# Patient Record
Sex: Female | Born: 1955 | Race: Black or African American | Hispanic: No | Smoking: Never smoker
Health system: Southern US, Community
[De-identification: ages and names within clinical notes are randomized; demographics above are authoritative.]

## PROBLEM LIST (undated history)

## (undated) DIAGNOSIS — Z9289 Personal history of other medical treatment: Secondary | ICD-10-CM

## (undated) DIAGNOSIS — I10 Essential (primary) hypertension: Secondary | ICD-10-CM

## (undated) DIAGNOSIS — D497 Neoplasm of unspecified behavior of endocrine glands and other parts of nervous system: Secondary | ICD-10-CM

## (undated) DIAGNOSIS — E785 Hyperlipidemia, unspecified: Secondary | ICD-10-CM

## (undated) DIAGNOSIS — C801 Malignant (primary) neoplasm, unspecified: Secondary | ICD-10-CM

## (undated) DIAGNOSIS — Z973 Presence of spectacles and contact lenses: Secondary | ICD-10-CM

## (undated) DIAGNOSIS — D509 Iron deficiency anemia, unspecified: Secondary | ICD-10-CM

## (undated) DIAGNOSIS — Z800 Family history of malignant neoplasm of digestive organs: Secondary | ICD-10-CM

## (undated) DIAGNOSIS — Z86010 Personal history of colonic polyps: Secondary | ICD-10-CM

## (undated) DIAGNOSIS — D649 Anemia, unspecified: Secondary | ICD-10-CM

## (undated) HISTORY — DX: Family history of malignant neoplasm of digestive organs: Z80.0

## (undated) HISTORY — PX: COLONOSCOPY: SHX174

---

## 1978-11-04 HISTORY — PX: TUBAL LIGATION: SHX77

## 1997-11-04 HISTORY — PX: ABDOMINAL HYSTERECTOMY: SHX81

## 2005-11-25 ENCOUNTER — Emergency Department: Payer: Self-pay | Admitting: Emergency Medicine

## 2012-11-04 HISTORY — PX: CARPAL TUNNEL RELEASE: SHX101

## 2013-11-04 HISTORY — PX: CATARACT EXTRACTION W/ INTRAOCULAR LENS  IMPLANT, BILATERAL: SHX1307

## 2014-03-18 ENCOUNTER — Other Ambulatory Visit: Payer: Self-pay | Admitting: Ophthalmology

## 2014-03-18 DIAGNOSIS — D444 Neoplasm of uncertain behavior of craniopharyngeal duct: Principal | ICD-10-CM

## 2014-03-18 DIAGNOSIS — D443 Neoplasm of uncertain behavior of pituitary gland: Secondary | ICD-10-CM

## 2014-03-22 ENCOUNTER — Ambulatory Visit
Admission: RE | Admit: 2014-03-22 | Discharge: 2014-03-22 | Disposition: A | Discharge status: Home / Self Care | Payer: BC Managed Care – PPO | Source: Ambulatory Visit | Attending: Ophthalmology | Admitting: Ophthalmology

## 2014-03-22 DIAGNOSIS — D443 Neoplasm of uncertain behavior of pituitary gland: Secondary | ICD-10-CM

## 2014-03-22 DIAGNOSIS — D444 Neoplasm of uncertain behavior of craniopharyngeal duct: Principal | ICD-10-CM

## 2014-03-22 MED ORDER — GADOBENATE DIMEGLUMINE 529 MG/ML IV SOLN
20.0000 mL | Freq: Once | INTRAVENOUS | Status: AC | PRN
  Administered 2014-03-22: 20 mL via INTRAVENOUS

## 2014-03-29 ENCOUNTER — Other Ambulatory Visit: Payer: Self-pay | Admitting: Neurosurgery

## 2014-04-08 NOTE — Pre-Procedure Instructions (Signed)
Cindy Ellis  04/08/2014   Your procedure is scheduled on:  Mon, June 22 @ 7:30 AM  Report to [facility] at 5:30 AM.  Call this number if you have problems the morning of surgery: [phone]   Remember:   Do not eat food or drink liquids after midnight. Atenolol and Amlodipine              Stop taking your Omega 3. No Goody's,BC's,Aleve,or any HErbal Medications     Do not wear jewelry, make-up or nail polish.  Do not wear lotions, powders, or perfumes. You may wear deodorant.  Do not shave 48 hours prior to surgery.   Do not bring valuables to the hospital.  [facility] is not responsible                  for any belongings or valuables.               Contacts, dentures or bridgework may not be worn into surgery.  Leave suitcase in the car. After surgery it may be brought to your room.  For patients admitted to the hospital, discharge time is determined by your                treatment team.                 Special Instructions:  [facility] - Preparing for Surgery  Before surgery, you can play an important role.  Because skin is not sterile, your skin needs to be as free of germs as possible.  You can reduce the number of germs on you skin by washing with CHG (chlorahexidine gluconate) soap before surgery.  CHG is an antiseptic cleaner which kills germs and bonds with the skin to continue killing germs even after washing.  Please DO NOT use if you have an allergy to CHG or antibacterial soaps.  If your skin becomes reddened/irritated stop using the CHG and inform your nurse when you arrive at Short Stay.  Do not shave (including legs and underarms) for at least 48 hours prior to the first CHG shower.  You may shave your face.  Please follow these instructions carefully:   1.  Shower with CHG Soap the night before surgery and the                                morning of Surgery.  2.  If you choose to wash your hair, wash your hair first as usual with your       normal  shampoo.  3.  After you shampoo, rinse your hair and body thoroughly to remove the                      Shampoo.  4.  Use CHG as you would any other liquid soap.  You can apply chg directly       to the skin and wash gently with scrungie or a clean washcloth.  5.  Apply the CHG Soap to your body ONLY FROM THE NECK DOWN.        Do not use on open wounds or open sores.  Avoid contact with your eyes,       ears, mouth and genitals (private parts).  Wash genitals (private parts)       with your normal soap.  6.  Wash thoroughly, paying special attention to the  area where your surgery        will be performed.  7.  Thoroughly rinse your body with warm water from the neck down.  8.  DO NOT shower/wash with your normal soap after using and rinsing off       the CHG Soap.  9.  Pat yourself dry with a clean towel.            10.  Wear clean pajamas.            11.  Place clean sheets on your bed the night of your first shower and do not        sleep with pets.  Day of Surgery  Do not apply any lotions/deoderants the morning of surgery.  Please wear clean clothes to the hospital/surgery center.     Please read over the following fact sheets that you were given: Pain Booklet, Coughing and Deep Breathing, Blood Transfusion Information and Surgical Site Infection Prevention

## 2014-04-11 ENCOUNTER — Encounter (HOSPITAL_COMMUNITY)
Admission: RE | Admit: 2014-04-11 | Discharge: 2014-04-11 | Disposition: A | Discharge status: Home / Self Care | Payer: BC Managed Care – PPO | Source: Ambulatory Visit | Attending: Anesthesiology | Admitting: Anesthesiology

## 2014-04-11 ENCOUNTER — Encounter (HOSPITAL_COMMUNITY)
Admission: RE | Admit: 2014-04-11 | Discharge: 2014-04-11 | Disposition: A | Discharge status: Home / Self Care | Payer: BC Managed Care – PPO | Source: Ambulatory Visit | Attending: Neurosurgery | Admitting: Neurosurgery

## 2014-04-11 ENCOUNTER — Encounter (HOSPITAL_COMMUNITY): Payer: Self-pay

## 2014-04-11 DIAGNOSIS — Z01812 Encounter for preprocedural laboratory examination: Secondary | ICD-10-CM | POA: Insufficient documentation

## 2014-04-11 DIAGNOSIS — Z01818 Encounter for other preprocedural examination: Secondary | ICD-10-CM | POA: Insufficient documentation

## 2014-04-11 DIAGNOSIS — Z01810 Encounter for preprocedural cardiovascular examination: Secondary | ICD-10-CM | POA: Insufficient documentation

## 2014-04-11 HISTORY — DX: Personal history of colonic polyps: Z86.010

## 2014-04-11 HISTORY — DX: Anemia, unspecified: D64.9

## 2014-04-11 HISTORY — DX: Personal history of other medical treatment: Z92.89

## 2014-04-11 HISTORY — DX: Neoplasm of unspecified behavior of endocrine glands and other parts of nervous system: D49.7

## 2014-04-11 HISTORY — DX: Essential (primary) hypertension: I10

## 2014-04-11 HISTORY — DX: Hyperlipidemia, unspecified: E78.5

## 2014-04-11 LAB — BASIC METABOLIC PANEL
BUN: 20 mg/dL (ref 6–23)
CO2: 27 mEq/L (ref 19–32)
CREATININE: 0.93 mg/dL (ref 0.50–1.10)
Calcium: 10 mg/dL (ref 8.4–10.5)
Chloride: 106 mEq/L (ref 96–112)
GFR, EST AFRICAN AMERICAN: 76 mL/min — AB (ref >90)
GFR, EST NON AFRICAN AMERICAN: 66 mL/min — AB (ref >90)
GLUCOSE: 96 mg/dL (ref 70–99)
POTASSIUM: 4.5 meq/L (ref 3.7–5.3)
Sodium: 145 mEq/L (ref 137–147)

## 2014-04-11 NOTE — Progress Notes (Signed)
04/11/14 1008  OBSTRUCTIVE SLEEP APNEA  Have you ever been diagnosed with sleep apnea through a sleep study? No  Do you snore loudly (loud enough to be heard through closed doors)?  1  Do you often feel tired, fatigued, or sleepy during the daytime? 0  Has anyone observed you stop breathing during your sleep? 1  Do you have, or are you being treated for high blood pressure? 1  BMI more than 35 kg/m2? 1  Age over 60 years old? 1  Neck circumference greater than 40 cm/16 inches? 1 (17 1/2)  Gender: 0  Obstructive Sleep Apnea Score 6  Score 4 or greater  Results sent to PCP

## 2014-04-11 NOTE — Progress Notes (Signed)
Pt doesn't have a cardiologist  Stress test done 1999  Denies ever having an echo or heart cath  Dr.Donald SPencer is Medical Md  Denies EKG or CXR in past yr

## 2014-04-11 NOTE — Progress Notes (Signed)
CBC needs to be redrawn DOS-lab states not enough blood obtained

## 2014-04-21 ENCOUNTER — Other Ambulatory Visit (HOSPITAL_COMMUNITY): Payer: BC Managed Care – PPO

## 2014-04-24 MED ORDER — CEFAZOLIN SODIUM-DEXTROSE 2-3 GM-% IV SOLR
2.0000 g | INTRAVENOUS | Status: DC
  Filled 2014-04-24: qty 50

## 2014-04-25 ENCOUNTER — Ambulatory Visit (HOSPITAL_COMMUNITY): Payer: BC Managed Care – PPO | Admitting: Certified Registered Nurse Anesthetist

## 2014-04-25 ENCOUNTER — Encounter (HOSPITAL_COMMUNITY)
Admission: RE | Disposition: A | Discharge status: Home / Self Care | Payer: Self-pay | Source: Ambulatory Visit | Attending: Neurosurgery

## 2014-04-25 ENCOUNTER — Inpatient Hospital Stay (HOSPITAL_COMMUNITY)
Admission: RE | Admit: 2014-04-25 | Discharge: 2014-04-28 | DRG: 620 | Disposition: A | Discharge status: Home / Self Care | Payer: BC Managed Care – PPO | Source: Ambulatory Visit | Attending: Neurosurgery | Admitting: Neurosurgery

## 2014-04-25 ENCOUNTER — Encounter (HOSPITAL_COMMUNITY): Payer: Self-pay | Admitting: *Deleted

## 2014-04-25 ENCOUNTER — Ambulatory Visit (HOSPITAL_COMMUNITY): Payer: BC Managed Care – PPO

## 2014-04-25 ENCOUNTER — Encounter (HOSPITAL_COMMUNITY): Payer: BC Managed Care – PPO | Admitting: Certified Registered Nurse Anesthetist

## 2014-04-25 DIAGNOSIS — Z79899 Other long term (current) drug therapy: Secondary | ICD-10-CM

## 2014-04-25 DIAGNOSIS — R51 Headache: Secondary | ICD-10-CM | POA: Diagnosis not present

## 2014-04-25 DIAGNOSIS — Z6841 Body Mass Index (BMI) 40.0 and over, adult: Secondary | ICD-10-CM

## 2014-04-25 DIAGNOSIS — E232 Diabetes insipidus: Secondary | ICD-10-CM | POA: Diagnosis not present

## 2014-04-25 DIAGNOSIS — I10 Essential (primary) hypertension: Secondary | ICD-10-CM | POA: Diagnosis present

## 2014-04-25 DIAGNOSIS — D352 Benign neoplasm of pituitary gland: Principal | ICD-10-CM | POA: Diagnosis present

## 2014-04-25 DIAGNOSIS — H5347 Heteronymous bilateral field defects: Secondary | ICD-10-CM | POA: Diagnosis present

## 2014-04-25 DIAGNOSIS — E785 Hyperlipidemia, unspecified: Secondary | ICD-10-CM | POA: Diagnosis present

## 2014-04-25 DIAGNOSIS — D353 Benign neoplasm of craniopharyngeal duct: Principal | ICD-10-CM

## 2014-04-25 HISTORY — PX: TRANSNASAL APPROACH: SHX6149

## 2014-04-25 HISTORY — PX: CRANIOTOMY: SHX93

## 2014-04-25 LAB — BASIC METABOLIC PANEL
BUN: 20 mg/dL (ref 6–23)
CO2: 23 mEq/L (ref 19–32)
Calcium: 8.8 mg/dL (ref 8.4–10.5)
Chloride: 103 mEq/L (ref 96–112)
Creatinine, Ser: 0.8 mg/dL (ref 0.50–1.10)
GFR calc Af Amer: >90 mL/min (ref >90)
GFR calc non Af Amer: 79 mL/min — ABNORMAL LOW (ref >90)
Glucose, Bld: 208 mg/dL — ABNORMAL HIGH (ref 70–99)
Potassium: 3.6 mEq/L — ABNORMAL LOW (ref 3.7–5.3)
Sodium: 139 mEq/L (ref 137–147)

## 2014-04-25 LAB — SURGICAL PCR SCREEN
MRSA, PCR: NEGATIVE
Staphylococcus aureus: NEGATIVE

## 2014-04-25 LAB — CBC
HEMATOCRIT: 43.9 % (ref 36.0–46.0)
Hemoglobin: 14 g/dL (ref 12.0–15.0)
MCH: 29.5 pg (ref 26.0–34.0)
MCHC: 31.9 g/dL (ref 30.0–36.0)
MCV: 92.4 fL (ref 78.0–100.0)
Platelets: 256 10*3/uL (ref 150–400)
RBC: 4.75 MIL/uL (ref 3.87–5.11)
RDW: 13.7 % (ref 11.5–15.5)
WBC: 9.1 10*3/uL (ref 4.0–10.5)

## 2014-04-25 LAB — TYPE AND SCREEN
ABO/RH(D): O POS
Antibody Screen: NEGATIVE

## 2014-04-25 LAB — ABO/RH: ABO/RH(D): O POS

## 2014-04-25 SURGERY — CRANIOTOMY HYPOPHYSECTOMY TRANSNASAL APPROACH
Anesthesia: General | Site: Nose

## 2014-04-25 MED ORDER — DEXTROSE 5 % IV SOLN
2.0000 g | INTRAVENOUS | Status: DC
  Administered 2014-04-26 – 2014-04-27 (×2): 2 g via INTRAVENOUS
  Filled 2014-04-25 (×2): qty 2

## 2014-04-25 MED ORDER — HYDROCORTISONE NA SUCCINATE PF 100 MG IJ SOLR
INTRAVENOUS | Status: AC
  Administered 2014-04-25: 100 mL/h via INTRAVENOUS
  Filled 2014-04-25: qty 1000

## 2014-04-25 MED ORDER — SODIUM CHLORIDE 0.9 % IR SOLN
Status: DC | PRN
  Administered 2014-04-25: 1000 mL

## 2014-04-25 MED ORDER — BIOTENE DRY MOUTH MT LIQD
15.0000 mL | Freq: Two times a day (BID) | OROMUCOSAL | Status: DC
  Administered 2014-04-25 – 2014-04-28 (×7): 15 mL via OROMUCOSAL

## 2014-04-25 MED ORDER — LISINOPRIL 40 MG PO TABS
40.0000 mg | ORAL_TABLET | Freq: Every day | ORAL | Status: DC
  Administered 2014-04-26 – 2014-04-28 (×3): 40 mg via ORAL
  Filled 2014-04-25 (×4): qty 1

## 2014-04-25 MED ORDER — MAGNESIUM HYDROXIDE 400 MG/5ML PO SUSP: 30.0000 mL | Freq: Every day | ORAL | Status: DC | PRN

## 2014-04-25 MED ORDER — HYDROCODONE-ACETAMINOPHEN 5-325 MG PO TABS
1.0000 | ORAL_TABLET | ORAL | Status: DC | PRN
  Administered 2014-04-25 (×2): 2 via ORAL
  Administered 2014-04-26: 1 via ORAL
  Administered 2014-04-26 (×2): 2 via ORAL
  Administered 2014-04-26 (×2): 1 via ORAL
  Administered 2014-04-27: 2 via ORAL
  Administered 2014-04-27: 1 via ORAL
  Administered 2014-04-27 – 2014-04-28 (×6): 2 via ORAL
  Filled 2014-04-25 (×6): qty 2
  Filled 2014-04-25: qty 1
  Filled 2014-04-25 (×3): qty 2
  Filled 2014-04-25: qty 1
  Filled 2014-04-25: qty 2
  Filled 2014-04-25 (×3): qty 1
  Filled 2014-04-25: qty 2

## 2014-04-25 MED ORDER — BISACODYL 10 MG RE SUPP: 10.0000 mg | Freq: Every day | RECTAL | Status: DC | PRN

## 2014-04-25 MED ORDER — LIDOCAINE HCL (CARDIAC) 20 MG/ML IV SOLN
INTRAVENOUS | Status: DC | PRN
  Administered 2014-04-25: 60 mg via INTRAVENOUS

## 2014-04-25 MED ORDER — ROCURONIUM BROMIDE 50 MG/5ML IV SOLN
INTRAVENOUS | Status: AC
  Filled 2014-04-25: qty 1

## 2014-04-25 MED ORDER — NEOSTIGMINE METHYLSULFATE 10 MG/10ML IV SOLN
INTRAVENOUS | Status: AC
  Filled 2014-04-25: qty 1

## 2014-04-25 MED ORDER — HYDROCORTISONE NA SUCCINATE PF 100 MG IJ SOLR
INTRAVENOUS | Status: AC
  Administered 2014-04-25 – 2014-04-26 (×2): via INTRAVENOUS
  Filled 2014-04-25 (×4): qty 1000

## 2014-04-25 MED ORDER — GLYCOPYRROLATE 0.2 MG/ML IJ SOLN
INTRAMUSCULAR | Status: AC
  Filled 2014-04-25: qty 3

## 2014-04-25 MED ORDER — HYDROMORPHONE HCL PF 1 MG/ML IJ SOLN: 0.2500 mg | INTRAMUSCULAR | Status: DC | PRN

## 2014-04-25 MED ORDER — PHENYLEPHRINE HCL 10 MG/ML IJ SOLN
10.0000 mg | INTRAVENOUS | Status: DC | PRN
  Administered 2014-04-25: 10 ug/min via INTRAVENOUS

## 2014-04-25 MED ORDER — THROMBIN 20000 UNITS EX SOLR
CUTANEOUS | Status: DC | PRN
  Administered 2014-04-25: 09:00:00 via TOPICAL

## 2014-04-25 MED ORDER — NEOSTIGMINE METHYLSULFATE 10 MG/10ML IV SOLN
INTRAVENOUS | Status: DC | PRN
  Administered 2014-04-25: 4 mg via INTRAVENOUS

## 2014-04-25 MED ORDER — ROCURONIUM BROMIDE 100 MG/10ML IV SOLN
INTRAVENOUS | Status: DC | PRN
  Administered 2014-04-25: 50 mg via INTRAVENOUS
  Administered 2014-04-25: 20 mg via INTRAVENOUS
  Administered 2014-04-25: 10 mg via INTRAVENOUS

## 2014-04-25 MED ORDER — TRIAMTERENE-HCTZ 37.5-25 MG PO TABS
1.0000 | ORAL_TABLET | Freq: Every day | ORAL | Status: DC
  Filled 2014-04-25 (×2): qty 1

## 2014-04-25 MED ORDER — ACETAMINOPHEN 10 MG/ML IV SOLN
1000.0000 mg | Freq: Once | INTRAVENOUS | Status: AC
  Administered 2014-04-25: 1000 mg via INTRAVENOUS
  Filled 2014-04-25: qty 100

## 2014-04-25 MED ORDER — ONDANSETRON HCL 4 MG PO TABS
4.0000 mg | ORAL_TABLET | ORAL | Status: DC | PRN
  Administered 2014-04-27: 4 mg via ORAL
  Filled 2014-04-25: qty 1

## 2014-04-25 MED ORDER — SODIUM CHLORIDE 0.9 % IV SOLN
INTRAVENOUS | Status: DC | PRN
  Administered 2014-04-25 (×2): via INTRAVENOUS

## 2014-04-25 MED ORDER — LABETALOL HCL 5 MG/ML IV SOLN: 5.0000 mg | INTRAVENOUS | Status: DC | PRN

## 2014-04-25 MED ORDER — ARTIFICIAL TEARS OP OINT
TOPICAL_OINTMENT | OPHTHALMIC | Status: DC | PRN
  Administered 2014-04-25: 1 via OPHTHALMIC

## 2014-04-25 MED ORDER — FERROUS SULFATE 325 (65 FE) MG PO TABS
325.0000 mg | ORAL_TABLET | Freq: Every day | ORAL | Status: DC
  Administered 2014-04-26 – 2014-04-28 (×3): 325 mg via ORAL
  Filled 2014-04-25 (×5): qty 1

## 2014-04-25 MED ORDER — ATORVASTATIN CALCIUM 40 MG PO TABS
40.0000 mg | ORAL_TABLET | Freq: Every day | ORAL | Status: DC
  Filled 2014-04-25 (×2): qty 1

## 2014-04-25 MED ORDER — MUPIROCIN CALCIUM 2 % EX CREA
TOPICAL_CREAM | Freq: Two times a day (BID) | CUTANEOUS | Status: DC
  Administered 2014-04-25: 22:00:00 via TOPICAL
  Administered 2014-04-25: 1 via TOPICAL
  Administered 2014-04-26: 23:00:00 via TOPICAL
  Administered 2014-04-26 – 2014-04-27 (×2): 1 via TOPICAL
  Administered 2014-04-27 – 2014-04-28 (×2): via TOPICAL
  Filled 2014-04-25: qty 15

## 2014-04-25 MED ORDER — MIDAZOLAM HCL 2 MG/2ML IJ SOLN
INTRAMUSCULAR | Status: AC
  Filled 2014-04-25: qty 2

## 2014-04-25 MED ORDER — ATENOLOL 25 MG PO TABS
25.0000 mg | ORAL_TABLET | Freq: Every day | ORAL | Status: DC
  Administered 2014-04-26 – 2014-04-28 (×3): 25 mg via ORAL
  Filled 2014-04-25 (×4): qty 1

## 2014-04-25 MED ORDER — PANTOPRAZOLE SODIUM 40 MG IV SOLR
40.0000 mg | Freq: Every day | INTRAVENOUS | Status: DC
  Administered 2014-04-25: 40 mg via INTRAVENOUS
  Filled 2014-04-25 (×2): qty 40

## 2014-04-25 MED ORDER — SODIUM CHLORIDE 0.9 % IR SOLN
Status: DC | PRN
  Administered 2014-04-25: 09:00:00

## 2014-04-25 MED ORDER — PHENYLEPHRINE 40 MCG/ML (10ML) SYRINGE FOR IV PUSH (FOR BLOOD PRESSURE SUPPORT)
PREFILLED_SYRINGE | INTRAVENOUS | Status: AC
  Filled 2014-04-25: qty 10

## 2014-04-25 MED ORDER — SUCCINYLCHOLINE CHLORIDE 20 MG/ML IJ SOLN
INTRAMUSCULAR | Status: AC
  Filled 2014-04-25: qty 1

## 2014-04-25 MED ORDER — ONDANSETRON HCL 4 MG/2ML IJ SOLN
INTRAMUSCULAR | Status: DC | PRN
  Administered 2014-04-25: 4 mg via INTRAVENOUS

## 2014-04-25 MED ORDER — GLYCOPYRROLATE 0.2 MG/ML IJ SOLN
INTRAMUSCULAR | Status: DC | PRN
  Administered 2014-04-25: 0.6 mg via INTRAVENOUS

## 2014-04-25 MED ORDER — OXYMETAZOLINE HCL 0.05 % NA SOLN
NASAL | Status: DC | PRN
  Administered 2014-04-25 (×2): 1 via NASAL

## 2014-04-25 MED ORDER — FENTANYL CITRATE 0.05 MG/ML IJ SOLN
INTRAMUSCULAR | Status: DC | PRN
  Administered 2014-04-25 (×5): 50 ug via INTRAVENOUS

## 2014-04-25 MED ORDER — DEXTROSE-NACL 5-0.45 % IV SOLN
INTRAVENOUS | Status: DC
  Administered 2014-04-25: 12:00:00 via INTRAVENOUS

## 2014-04-25 MED ORDER — FENTANYL CITRATE 0.05 MG/ML IJ SOLN
INTRAMUSCULAR | Status: AC
  Filled 2014-04-25: qty 5

## 2014-04-25 MED ORDER — AMLODIPINE BESYLATE 5 MG PO TABS
5.0000 mg | ORAL_TABLET | Freq: Every day | ORAL | Status: DC
  Administered 2014-04-26 – 2014-04-28 (×3): 5 mg via ORAL
  Filled 2014-04-25 (×4): qty 1

## 2014-04-25 MED ORDER — ONDANSETRON HCL 4 MG/2ML IJ SOLN
4.0000 mg | INTRAMUSCULAR | Status: DC | PRN
  Administered 2014-04-25 – 2014-04-26 (×2): 4 mg via INTRAVENOUS
  Filled 2014-04-25 (×2): qty 2

## 2014-04-25 MED ORDER — PROPOFOL 10 MG/ML IV BOLUS
INTRAVENOUS | Status: AC
  Filled 2014-04-25: qty 20

## 2014-04-25 MED ORDER — MUPIROCIN 2 % EX OINT
TOPICAL_OINTMENT | CUTANEOUS | Status: DC | PRN
  Administered 2014-04-25: 1 via NASAL

## 2014-04-25 MED ORDER — DEXTROSE 5 % IV SOLN
2.0000 g | INTRAVENOUS | Status: AC
  Administered 2014-04-25: 2 g via INTRAVENOUS
  Filled 2014-04-25 (×2): qty 2

## 2014-04-25 MED ORDER — LIDOCAINE-EPINEPHRINE 1 %-1:100000 IJ SOLN
INTRAMUSCULAR | Status: DC | PRN
  Administered 2014-04-25: 7 mL
  Administered 2014-04-25: 10 mL

## 2014-04-25 MED ORDER — MUPIROCIN 2 % EX OINT
TOPICAL_OINTMENT | CUTANEOUS | Status: AC
  Administered 2014-04-25: 1 via NASAL
  Filled 2014-04-25: qty 22

## 2014-04-25 MED ORDER — SUCCINYLCHOLINE CHLORIDE 20 MG/ML IJ SOLN
INTRAMUSCULAR | Status: DC | PRN
  Administered 2014-04-25: 120 mg via INTRAVENOUS

## 2014-04-25 MED ORDER — PROPOFOL 10 MG/ML IV BOLUS
INTRAVENOUS | Status: DC | PRN
  Administered 2014-04-25: 200 mg via INTRAVENOUS

## 2014-04-25 MED ORDER — LIDOCAINE HCL (CARDIAC) 20 MG/ML IV SOLN
INTRAVENOUS | Status: AC
  Filled 2014-04-25: qty 5

## 2014-04-25 MED ORDER — LIDOCAINE-EPINEPHRINE 1 %-1:100000 IJ SOLN
INTRAMUSCULAR | Status: AC
  Filled 2014-04-25: qty 1

## 2014-04-25 MED ORDER — MORPHINE SULFATE 2 MG/ML IJ SOLN
1.0000 mg | INTRAMUSCULAR | Status: DC | PRN
  Administered 2014-04-25 (×2): 2 mg via INTRAVENOUS
  Administered 2014-04-25: 1 mg via INTRAVENOUS
  Administered 2014-04-25 – 2014-04-26 (×6): 2 mg via INTRAVENOUS
  Filled 2014-04-25 (×9): qty 1

## 2014-04-25 MED ORDER — HYDRALAZINE HCL 20 MG/ML IJ SOLN: 5.0000 mg | INTRAMUSCULAR | Status: DC | PRN

## 2014-04-25 SURGICAL SUPPLY — 130 items
ATTRACTOMAT 16X20 MAGNETIC DRP (DRAPES) IMPLANT
BAG DECANTER FOR FLEXI CONT (MISCELLANEOUS) IMPLANT
BENZOIN TINCTURE PRP APPL 2/3 (GAUZE/BANDAGES/DRESSINGS) ×3 IMPLANT
BLADE 10 SAFETY STRL DISP (BLADE) IMPLANT
BLADE EYE SICKLE 84 5 BEAV (BLADE) IMPLANT
BLADE ROTATE RAD 40 4 M4 (BLADE) IMPLANT
BLADE ROTATE TRICUT 4X13 M4 (BLADE) ×3 IMPLANT
BLADE SURG 10 STRL SS (BLADE) ×3 IMPLANT
BLADE SURG 11 STRL SS (BLADE) ×6 IMPLANT
BLADE SURG 15 STRL LF DISP TIS (BLADE) ×2 IMPLANT
BLADE SURG 15 STRL SS (BLADE) ×1
BUR NEURO DIAMOND 3X3.8 (BURR) IMPLANT
BURR NEURO DIAMOND 3X3.8 (BURR)
CANISTER SUCT 3000ML (MISCELLANEOUS) ×9 IMPLANT
CATH ROBINSON RED A/P 10FR (CATHETERS) ×3 IMPLANT
CATH ROBINSON RED A/P 14FR (CATHETERS) IMPLANT
COAGULATOR SUCT SWTCH 10FR 6 (ELECTROSURGICAL) IMPLANT
CONT SPEC 4OZ CLIKSEAL STRL BL (MISCELLANEOUS) ×6 IMPLANT
CORDS BIPOLAR (ELECTRODE) ×3 IMPLANT
COTTONBALL LRG STERILE PKG (GAUZE/BANDAGES/DRESSINGS) ×3 IMPLANT
DECANTER SPIKE VIAL GLASS SM (MISCELLANEOUS) ×3 IMPLANT
DEPRESSOR TONGUE BLADE STERILE (MISCELLANEOUS) ×3 IMPLANT
DERMABOND ADHESIVE PROPEN (GAUZE/BANDAGES/DRESSINGS) ×1
DERMABOND ADVANCED .7 DNX6 (GAUZE/BANDAGES/DRESSINGS) ×2 IMPLANT
DRAIN SUBARACHNOID (WOUND CARE) IMPLANT
DRAPE C-ARM 42X72 X-RAY (DRAPES) ×6 IMPLANT
DRAPE EENT ADH APERT 15X15 STR (DRAPES) IMPLANT
DRAPE INCISE IOBAN 66X45 STRL (DRAPES) ×3 IMPLANT
DRAPE MICROSCOPE LEICA (MISCELLANEOUS) ×3 IMPLANT
DRAPE POUCH INSTRU U-SHP 10X18 (DRAPES) ×3 IMPLANT
DRAPE PROXIMA HALF (DRAPES) ×9 IMPLANT
DRESSING NASAL KENNEDY 3.5X.9 (MISCELLANEOUS) IMPLANT
DRESSING NASAL POPE 10X1.5X2.5 (GAUZE/BANDAGES/DRESSINGS) ×4 IMPLANT
DRSG NASAL KENNEDY 3.5X.9 (MISCELLANEOUS)
DRSG NASAL POPE 10X1.5X2.5 (GAUZE/BANDAGES/DRESSINGS) ×6
DRSG TELFA 3X8 NADH (GAUZE/BANDAGES/DRESSINGS) ×6 IMPLANT
DURAPREP 26ML APPLICATOR (WOUND CARE) ×3 IMPLANT
ELECT BLADE 4.0 EZ CLEAN MEGAD (MISCELLANEOUS) ×3
ELECT CAUTERY BLADE 6.4 (BLADE) ×3 IMPLANT
ELECT COATED BLADE 2.86 ST (ELECTRODE) ×3 IMPLANT
ELECT NEEDLE TIP 2.8 STRL (NEEDLE) ×3 IMPLANT
ELECT REM PT RETURN 9FT ADLT (ELECTROSURGICAL) ×6
ELECTRODE BLDE 4.0 EZ CLN MEGD (MISCELLANEOUS) ×2 IMPLANT
ELECTRODE REM PT RTRN 9FT ADLT (ELECTROSURGICAL) ×4 IMPLANT
FILTER ARTHROSCOPY CONVERTOR (FILTER) IMPLANT
GAUZE PACKING FOLDED 1IN STRL (GAUZE/BANDAGES/DRESSINGS) ×3 IMPLANT
GAUZE PACKING FOLDED 2  STR (GAUZE/BANDAGES/DRESSINGS) ×1
GAUZE PACKING FOLDED 2 STR (GAUZE/BANDAGES/DRESSINGS) ×2 IMPLANT
GAUZE SPONGE 2X2 8PLY STRL LF (GAUZE/BANDAGES/DRESSINGS) ×2 IMPLANT
GLOVE BIOGEL M 7.0 STRL (GLOVE) ×6 IMPLANT
GLOVE BIOGEL PI IND STRL 7.5 (GLOVE) ×4 IMPLANT
GLOVE BIOGEL PI IND STRL 8 (GLOVE) ×2 IMPLANT
GLOVE BIOGEL PI INDICATOR 7.5 (GLOVE) ×2
GLOVE BIOGEL PI INDICATOR 8 (GLOVE) ×1
GLOVE ECLIPSE 7.5 STRL STRAW (GLOVE) ×6 IMPLANT
GLOVE EXAM NITRILE LRG STRL (GLOVE) IMPLANT
GLOVE EXAM NITRILE MD LF STRL (GLOVE) IMPLANT
GLOVE EXAM NITRILE XL STR (GLOVE) IMPLANT
GLOVE EXAM NITRILE XS STR PU (GLOVE) IMPLANT
GLOVE SS N UNI LF 7.5 STRL (GLOVE) ×12 IMPLANT
GLOVE SURG SS PI 7.0 STRL IVOR (GLOVE) ×3 IMPLANT
GOWN STRL NON-REIN LRG LVL3 (GOWN DISPOSABLE) IMPLANT
GOWN STRL REUS W/ TWL LRG LVL3 (GOWN DISPOSABLE) ×2 IMPLANT
GOWN STRL REUS W/ TWL XL LVL3 (GOWN DISPOSABLE) ×8 IMPLANT
GOWN STRL REUS W/TWL 2XL LVL3 (GOWN DISPOSABLE) IMPLANT
GOWN STRL REUS W/TWL LRG LVL3 (GOWN DISPOSABLE) ×1
GOWN STRL REUS W/TWL XL LVL3 (GOWN DISPOSABLE) ×4
HEMOSTAT SURGICEL 2X14 (HEMOSTASIS) ×3 IMPLANT
KIT BASIN OR (CUSTOM PROCEDURE TRAY) ×6 IMPLANT
KIT ROOM TURNOVER OR (KITS) ×6 IMPLANT
MARKER SKIN DUAL TIP RULER LAB (MISCELLANEOUS) ×3 IMPLANT
NEEDLE 18GX1X1/2 (RX/OR ONLY) (NEEDLE) IMPLANT
NEEDLE HYPO 25X1 1.5 SAFETY (NEEDLE) ×6 IMPLANT
NEEDLE SPNL 22GX3.5 QUINCKE BK (NEEDLE) ×3 IMPLANT
NS IRRIG 1000ML POUR BTL (IV SOLUTION) ×6 IMPLANT
PAD ARMBOARD 7.5X6 YLW CONV (MISCELLANEOUS) ×9 IMPLANT
PAD ENT ADHESIVE 25PK (MISCELLANEOUS) ×3 IMPLANT
PATTIES SURGICAL .25X.25 (GAUZE/BANDAGES/DRESSINGS) IMPLANT
PATTIES SURGICAL .5 X.5 (GAUZE/BANDAGES/DRESSINGS) ×3 IMPLANT
PATTIES SURGICAL .5 X3 (DISPOSABLE) ×3 IMPLANT
PENCIL BUTTON HOLSTER BLD 10FT (ELECTRODE) ×3 IMPLANT
RUBBERBAND STERILE (MISCELLANEOUS) ×12 IMPLANT
SHEET SIL 040 (INSTRUMENTS) IMPLANT
SPECIMEN JAR SMALL (MISCELLANEOUS) ×6 IMPLANT
SPLINT NASAL DOYLE BI-VL (GAUZE/BANDAGES/DRESSINGS) ×3 IMPLANT
SPONGE GAUZE 2X2 STER 10/PKG (GAUZE/BANDAGES/DRESSINGS) ×1
SPONGE GAUZE 4X4 12PLY (GAUZE/BANDAGES/DRESSINGS) ×3 IMPLANT
SPONGE LAP 4X18 X RAY DECT (DISPOSABLE) ×3 IMPLANT
SPONGE NEURO XRAY DETECT 1X3 (DISPOSABLE) ×3 IMPLANT
SPONGE SURGIFOAM ABS GEL 100 (HEMOSTASIS) IMPLANT
SPONGE SURGIFOAM ABS GEL 12-7 (HEMOSTASIS) IMPLANT
STAPLER SKIN PROX WIDE 3.9 (STAPLE) ×3 IMPLANT
STRIP CLOSURE SKIN 1/2X4 (GAUZE/BANDAGES/DRESSINGS) IMPLANT
STRIP CLOSURE SKIN 1/4X4 (GAUZE/BANDAGES/DRESSINGS) IMPLANT
SUT BONE WAX W31G (SUTURE) ×3 IMPLANT
SUT CHROMIC 3 0 PS 2 (SUTURE) IMPLANT
SUT CHROMIC 4 0 P 3 18 (SUTURE) IMPLANT
SUT ETHILON 3 0 FSL (SUTURE) IMPLANT
SUT ETHILON 3 0 PS 1 (SUTURE) IMPLANT
SUT ETHILON 4 0 PS 2 18 (SUTURE) IMPLANT
SUT ETHILON 6 0 P 1 (SUTURE) IMPLANT
SUT NOVAFIL 6 0 PRE 2 4412 13 (SUTURE) IMPLANT
SUT PDS AB 4-0 RB1 27 (SUTURE) ×3 IMPLANT
SUT PLAIN 4 0 ~~LOC~~ 1 (SUTURE) IMPLANT
SUT PROLENE 6 0 BV (SUTURE) IMPLANT
SUT VIC AB 2-0 CP2 18 (SUTURE) ×3 IMPLANT
SUT VIC AB 2-0 CT1 27 (SUTURE)
SUT VIC AB 2-0 CT1 27XBRD (SUTURE) IMPLANT
SUT VIC AB 3-0 SH 8-18 (SUTURE) ×3 IMPLANT
SUT VIC AB 4-0 P-3 18X BRD (SUTURE) IMPLANT
SUT VIC AB 4-0 P3 18 (SUTURE)
SWAB COLLECTION DEVICE MRSA (MISCELLANEOUS) IMPLANT
SYR 5ML LL (SYRINGE) ×3 IMPLANT
SYR BULB 3OZ (MISCELLANEOUS) IMPLANT
SYR CONTROL 10ML LL (SYRINGE) IMPLANT
SYR TB 1ML 25GX5/8 (SYRINGE) IMPLANT
TOWEL OR 17X24 6PK STRL BLUE (TOWEL DISPOSABLE) ×6 IMPLANT
TOWEL OR 17X26 10 PK STRL BLUE (TOWEL DISPOSABLE) ×6 IMPLANT
TRACKER ENT INSTRUMENT (MISCELLANEOUS) ×3 IMPLANT
TRACKER ENT PATIENT (MISCELLANEOUS) ×3 IMPLANT
TRAP SPECIMEN MUCOUS 40CC (MISCELLANEOUS) IMPLANT
TRAY ENT MC OR (CUSTOM PROCEDURE TRAY) ×6 IMPLANT
TRAY FOLEY CATH 16FRSI W/METER (SET/KITS/TRAYS/PACK) ×3 IMPLANT
TUBE ANAEROBIC SPECIMEN COL (MISCELLANEOUS) IMPLANT
TUBE CONNECTING 12X1/4 (SUCTIONS) ×6 IMPLANT
TUBING EXTENTION W/L.L. (IV SETS) ×3 IMPLANT
TUBING STRAIGHTSHOT EPS 5PK (TUBING) ×3 IMPLANT
UNDERPAD 30X30 INCONTINENT (UNDERPADS AND DIAPERS) ×3 IMPLANT
WATER STERILE IRR 1000ML POUR (IV SOLUTION) ×9 IMPLANT
WIPE INSTRUMENT VISIWIPE 73X73 (MISCELLANEOUS) ×3 IMPLANT

## 2014-04-25 NOTE — Anesthesia Procedure Notes (Signed)
Procedure Name: Intubation Date/Time: 04/25/2014 7:42 AM Performed by: Maeola Harman Pre-anesthesia Checklist: Patient identified, Emergency Drugs available, Suction available, Patient being monitored and Timeout performed Patient Re-evaluated:Patient Re-evaluated prior to inductionOxygen Delivery Method: Circle system utilized Preoxygenation: Pre-oxygenation with 100% oxygen Intubation Type: IV induction Ventilation: Mask ventilation without difficulty Laryngoscope Size: Mac and 3 Grade View: Grade I Tube type: Oral Tube size: 7.5 mm Number of attempts: 1 Airway Equipment and Method: Stylet Placement Confirmation: ETT inserted through vocal cords under direct vision,  positive ETCO2 and breath sounds checked- equal and bilateral Secured at: 22 cm Tube secured with: Tape Dental Injury: Teeth and Oropharynx as per pre-operative assessment  Comments: Easy atraumatic induction and intubation with MAC 3 blade.  Dr Oletta Lamas verified placement after tube inserted and after positioning.  Waldron Session, CRNA

## 2014-04-25 NOTE — H&P (Signed)
Subjective: Patient is a 58 y.o. female who is admitted for treatment of large pituitary tumor, presenting with a bitemporal hemianopsia.  Patient had undergone cataract surgery, they were never able to establish eyeglass prescription because of continued difficulty with her vision, and further ophthalmologic evaluation revealed bitemporal hemianopsia. MRI revealed the large pituitary tumor. Patient is admitted now for transsphenoidal resection of pituitary tumor in a co-surgeon fashion between myself from the neurosurgical service, Dr. Jerrell Belfast from the ENT service.     Past Medical History  Diagnosis Date  . Hypertension     takes Lisinopril,Atenolol,Amlodipine,and Triam-HCTZ daily  . Hyperlipidemia     takes Atorvastatin daily  . Headache(784.0)     occasionally  . Pituitary tumor   . History of colon polyps   . Anemia     hx of;when she was younger  . History of blood transfusion     Past Surgical History  Procedure Laterality Date  . Tubal ligation  1980  . Abdominal hysterectomy  1999  . Carpal tunnel repair Bilateral 2014  . Cataract surgery Bilateral   . Colonoscopy      Prescriptions prior to admission  Medication Sig Dispense Refill  . amLODipine (NORVASC) 5 MG tablet Take 5 mg by mouth daily.       Marland Kitchen atenolol (TENORMIN) 25 MG tablet Take 25 mg by mouth daily.       Marland Kitchen atorvastatin (LIPITOR) 40 MG tablet Take 40 mg by mouth daily.       . Cholecalciferol (VITAMIN D3) 2000 UNITS TABS Take 1 tablet by mouth daily.      . ferrous sulfate 325 (65 FE) MG tablet Take 325 mg by mouth daily with breakfast.      . Garlic 10 MG CAPS Take 10 mg by mouth daily.      Marland Kitchen lisinopril (PRINIVIL,ZESTRIL) 40 MG tablet Take 40 mg by mouth daily.       . Omega-3 Fatty Acids (FISH OIL EXTRA STRENGTH) 435 MG CAPS Take 1 capsule by mouth daily.       Marland Kitchen SM MULTIPLE VITAMINS/IRON TABS Take 1 tablet by mouth daily.       Marland Kitchen triamterene-hydrochlorothiazide (MAXZIDE-25) 37.5-25 MG per tablet  Take 1 tablet by mouth daily.       . vitamin B-12 (CYANOCOBALAMIN) 500 MCG tablet Take 500 mcg by mouth daily.       No Known Allergies  History  Substance Use Topics  . Smoking status: Never Smoker   . Smokeless tobacco: Not on file  . Alcohol Use: No    No family history on file.   Review of Systems A comprehensive review of systems was negative.  Objective: Vital signs in last 24 hours: Temp:  [97 F (36.1 C)] 97 F (36.1 C) (06/22 0631) Pulse Rate:  [71] 71 (06/22 0631) BP: (152)/(61) 152/61 mmHg (06/22 0631) SpO2:  [99 %] 99 % (06/22 0631)  EXAM: Patient is a morbidly obese black female in no acute distress. Lungs are clear to auscultation , the patient has symmetrical respiratory excursion. Heart has a regular rate and rhythm normal S1 and S2 no murmur.   Abdomen is soft nontender nondistended bowel sounds are present. Extremity examination shows no clubbing cyanosis or edema. Neurologic examination shows that he is awake, alert, fully oriented. Speech is fluent. She has good comprehension. 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 Pupils are equal round and reactive to light. EOMI. Facial sensation intact.  Facial movements symmetrical. Hearing is present bilaterally. Palatal movement is symmetrical. Shoulder shrug is symmetrical. Tongue is midline. Motor examination shows 5/5 strength in the upper and lower extremities, with no drift of the upper extremities. Sensation is intact to pinprick throughout. Reflexes are symmetrical. She has a normal gait and stance.  Data Review:CBC    Component Value Date/Time   WBC 9.1 04/25/2014 0603   RBC 4.75 04/25/2014 0603   HGB 14.0 04/25/2014 0603   HCT 43.9 04/25/2014 0603   PLT 256 04/25/2014 0603   MCV 92.4 04/25/2014 0603   MCH 29.5 04/25/2014 0603   MCHC 31.9 04/25/2014 0603   RDW 13.7 04/25/2014 0603                          BMET    Component Value Date/Time   NA  145 04/11/2014 1026   K 4.5 04/11/2014 1026   CL 106 04/11/2014 1026   CO2 27 04/11/2014 1026   GLUCOSE 96 04/11/2014 1026   BUN 20 04/11/2014 1026   CREATININE 0.93 04/11/2014 1026   CALCIUM 10.0 04/11/2014 1026   GFRNONAA 66* 04/11/2014 1026   GFRAA 76* 04/11/2014 1026     Assessment/Plan: Patient with a large pituitary tumor, presenting with bitemporal hemianopsia, who is admitted now for transsphenoidal resection of pituitary tumor. I've discussed the nature the patient's condition, the nature the surgical procedure, and risks of surgery including infection, bleeding, possibly for transfusion, the risk of neurologic dysfunction including loss of vision, paralysis, coma, and death, and risks of CSF leakage and possibly for further surgery, risk of residual tumor requiring further surgery, and anesthetic risks of myocardial infarction, stroke, pneumonia, and death. Understanding all this the patient wished to proceed with surgery, she is already consulted with Dr. Jerrell Belfast from the ENT, and together we will to the surgery in a co-surgeon fashion.  Hosie Spangle, MD 04/25/2014 7:25 AM

## 2014-04-25 NOTE — Progress Notes (Signed)
Pt continues to complain of a 9 out of 10 headache unrelieved by Morphine. Pt has received a total of 5 mg of Morphine for pain. Pt describes the pain as constant pressure behind the eyes. Notified Dr. Sherwood Gambler of pain control issues; Tylenol IV ordered. Will continue to monitor.

## 2014-04-25 NOTE — Plan of Care (Signed)
Problem: Consults Goal: Diagnosis - Craniotomy Outcome: Completed/Met Date Met:  04/25/14 Tumor  transphenoidal

## 2014-04-25 NOTE — Anesthesia Postprocedure Evaluation (Signed)
  Anesthesia Post-op Note  Patient: Cindy Ellis  Procedure(s) Performed: Procedure(s) with comments: CRANIOTOMY HYPOPHYSECTOMY TRANSNASAL APPROACH (N/A) - Transphenoidal resection of pituitary tumor Dr Wilburn Cornelia to approach TRANSNASAL APPROACH (N/A) - TRANSNASAL APPROACH  Patient Location: PACU  Anesthesia Type:General  Level of Consciousness: awake  Airway and Oxygen Therapy: Patient Spontanous Breathing  Post-op Pain: mild  Post-op Assessment: Post-op Vital signs reviewed  Post-op Vital Signs: Reviewed  Last Vitals:  Filed Vitals:   04/25/14 1215  BP: 158/68  Pulse: 63  Temp:   Resp: 13    Complications: No apparent anesthesia complications

## 2014-04-25 NOTE — Plan of Care (Signed)
Problem: Phase I Progression Outcomes Goal: Respiratory status stable Outcome: Completed/Met Date Met:  04/25/14 Patient on room air sating 95%

## 2014-04-25 NOTE — Progress Notes (Signed)
   ENT Progress Note: Procedure(s): CRANIOTOMY HYPOPHYSECTOMY TRANSNASAL APPROACH TRANSNASAL APPROACH   Subjective: Pt seen in pre-op Stable   Objective: Vital signs in last 24 hours: Temp:  [97 F (36.1 C)] 97 F (36.1 C) (06/22 0631) Pulse Rate:  [71] 71 (06/22 0631) BP: (152)/(61) 152/61 mmHg (06/22 0631) SpO2:  [99 %] 99 % (06/22 0631) Weight change:     Intake/Output from previous day:   Intake/Output this shift:    Labs:  Recent Labs  04/25/14 0603  WBC 9.1  HGB 14.0  HCT 43.9  PLT 256   No results found for this basename: NA, K, CL, CO2, GLUCOSE, BUN, CREATININR, CALCIUM,  in the last 72 hours  Studies/Results: No results found.   PHYSICAL EXAM: Patent nasal airway No d/c or infection   Assessment/Plan: Pt adm for trans-septal trans-sphenoidal resection of pituitary tumor.     [place], DAVID 04/25/2014, 7:42 AM

## 2014-04-25 NOTE — Transfer of Care (Signed)
Immediate Anesthesia Transfer of Care Note  Patient: Cindy Ellis  Procedure(s) Performed: Procedure(s) with comments: CRANIOTOMY HYPOPHYSECTOMY TRANSNASAL APPROACH (N/A) - Transphenoidal resection of pituitary tumor Dr Wilburn Cornelia to approach TRANSNASAL APPROACH (N/A) - TRANSNASAL APPROACH  Patient Location: PACU  Anesthesia Type:General  Level of Consciousness: awake, alert  and sedated  Airway & Oxygen Therapy: Patient connected to face mask oxygen  Post-op Assessment: Report given to PACU RN  Post vital signs: stable  Complications: No apparent anesthesia complications

## 2014-04-25 NOTE — Op Note (Addendum)
04/25/2014  10:00 AM  PATIENT:  Cindy Ellis  58 y.o. 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 Harvesting of a fat autograft from the right lower bone wall.  SURGEON:  Surgeon(s): Hosie Spangle, MD Jerrell Belfast, MD  ANESTHESIA:   general  EBL:  Total I/O In: 1000 [I.V.:1000] Out: 32 [Urine:130; Blood:150]  BLOOD ADMINISTERED:none  COUNT: Correct per nursing staff  SPECIMEN:  Source of Specimen:  Pituitary tumor  DICTATION: Surgery was done in a co-surgeon fashion between myself from the neurosurgical service and Dr. Jerrell Belfast from the ENT service. Dr. Wilburn Cornelia performed the approach and closure, and I performed a tumor resection. This is a dictation of the neurosurgical portion of the procedure, Dr. Wilburn Cornelia will dictate his portions of the procedure.  Patient was brought to the operating room, placed under general endotracheal anesthesia. Dr. Wilburn Cornelia and I position the patient with the head gently tilted to the left, and gently flexed, with the back of the bed gently raised. The staff that the right lower abdomen with DuraPrep, and was draped in a sterile fashion. The staff and Dr. Wilburn Cornelia performed a prep around the face and nose. Dr. Wilburn Cornelia performed the approach into the sphenoid sinus, and placed the retractor. C-arm fluoroscopy and direct visualization confirmed good localization of the anterior wall of the sella. The operating microscope was draped and brought in the field to provide additional magnification, illumination, and visualization, and the tumor resection was performed using microdissection microsurgical technique. I scrubbed in at this point and this is  dictation of the neurosurgical portion of procedure. The anterior wall of the sella was thin, and I removed the overlying mucosa. Using a variety of Kerrison punches we removed the remaining anterior bony wall of the sella, exposing the underlying dura. The dura was opened in a X-shaped fashion with a 11 scalpel, and yellow tumor cystic fluid drained readily, and was aspirated away. We explored the tumor cyst, and then used a variety of small ring curettes to remove the tumor. Specimen was sent to pathology in formalin for permanent examination. All of the abnormal tissue within the wall of the cyst was removed, it appeared that good decompression had been achieved, and the remaining superior aspect of the capsule pulsated gently. There was also more normal-looking tissue posteriorly that we suspected represented the remaining pituitary gland. Using the C-arm fluoroscope we confirm the extent of the resection. We established hemostasis with bipolar cautery and Gelfoam with thrombin. The right lower quadrant of the abdomen, which had already been prepped, was exposed. The overlying skin and subcutaneous tissue were infiltrated with 1% Xylocaine local anesthetic with epinephrine. A 1 inch horizontal incision was made, carried down to the superficial subcutaneous tissue, and a small piece of adipose autograft was harvested. Hemostasis was established and then confirmed. The subcutaneous subcuticular closed with interrupted inverted 3-0 Vicryl suture. Skin is approximate Dermabond. The Gelfoam was removed, good hemostasis confirmed. A small piece of the fat graft was then placed in the remaining tumor cyst cavity, and then we cut and shaped a piece of nasal bone which was gently positioned between the remaining bony edges of the sella and the dura, to act as a buttress for the fat graft. At this point good hemostasis again confirmed, and the procedure was turned back over  to Dr. Wilburn Cornelia for the closure, which again he  will dictate separately. Once surgeries completed, the patient is to be reversed and the anesthetic, extubated, and transferred to the recovery room for further care.  PLAN OF CARE: Admit to inpatient   PATIENT DISPOSITION:  PACU - hemodynamically stable.   Delay start of Pharmacological VTE agent (>24hrs) due to surgical blood loss or risk of bleeding:  yes

## 2014-04-25 NOTE — Anesthesia Preprocedure Evaluation (Addendum)
Anesthesia Evaluation  Patient identified by MRN, date of birth, ID band Patient awake    Reviewed: Allergy & Precautions, H&P , NPO status , Patient's Chart, lab work & pertinent test results  Airway Mallampati: II      Dental   Pulmonary neg pulmonary ROS,          Cardiovascular hypertension, Pt. on medications and Pt. on home beta blockers     Neuro/Psych  Headaches, negative psych ROS   GI/Hepatic negative GI ROS, Neg liver ROS,   Endo/Other  negative endocrine ROS  Renal/GU negative Renal ROS     Musculoskeletal negative musculoskeletal ROS (+)   Abdominal   Peds  Hematology negative hematology ROS (+) anemia ,   Anesthesia Other Findings   Reproductive/Obstetrics negative OB ROS                          Anesthesia Physical Anesthesia Plan  ASA: III  Anesthesia Plan: General   Post-op Pain Management:    Induction: Intravenous  Airway Management Planned: Oral ETT  Additional Equipment:   Intra-op Plan:   Post-operative Plan: Possible Post-op intubation/ventilation  Informed Consent: I have reviewed the patients History and Physical, chart, labs and discussed the procedure including the risks, benefits and alternatives for the proposed anesthesia with the patient or authorized representative who has indicated his/her understanding and acceptance.   Dental advisory given  Plan Discussed with: CRNA and Anesthesiologist  Anesthesia Plan Comments:         Anesthesia Quick Evaluation

## 2014-04-25 NOTE — Progress Notes (Signed)
Subjective: Patient resting in bed, has been having at times fairly severe headache. Did respond well to Tylenol IV. Had some nausea earlier, responded to an antiemetic, asking for po's.  Urine output stable, specific gravities stable.  Objective: Vital signs in last 24 hours: Filed Vitals:   04/25/14 1500 04/25/14 1542 04/25/14 1600 04/25/14 1700  BP: 147/69  160/64 145/65  Pulse: 57  75 64  Temp:  98 F (36.7 C)    TempSrc:  Axillary    Resp: 15  12 16   Height:      SpO2: 96%  97% 98%    Intake/Output from previous day:   Intake/Output this shift: Total I/O In: 2425 [I.V.:2325; IV Piggyback:100] Out: 1605 [Urine:1405; Blood:200]  Physical Exam:  Awake and alert, oriented. Following commands. Moving all 4 extremities well. Vision good.  CBC  Recent Labs  04/25/14 0603  WBC 9.1  HGB 14.0  HCT 43.9  PLT 256   BMET  Recent Labs  04/25/14 1245  NA 139  K 3.6*  CL 103  CO2 23  GLUCOSE 208*  BUN 20  CREATININE 0.80  CALCIUM 8.8   Studies/Results: Dg Sella Turcica  04/25/2014   CLINICAL DATA:  Pituitary tumor resection  EXAM: SELLA TURCICA  COMPARISON:  MRI of the brain 03/22/2014.  FINDINGS: A single intraoperative view demonstrates a surgical probe at the level of the anterior clinoid within the sella turcica.  IMPRESSION: Intraoperative localization of the sella turcica.   Electronically Signed   By: Lawrence Santiago M.D.   On: 04/25/2014 10:48   Dg C-arm 1-60 Min  04/25/2014   CLINICAL DATA:  Pituitary tumor resection.  EXAM: DG C-ARM 61-120 MIN  FLUOROSCOPY TIME:  7 seconds  COMPARISON:  03/22/2014 MR  FINDINGS: Single intraoperative lateral view submitted for review after surgery.  Metallic probe projects over the clinoids. Transsphenoidal approach.  IMPRESSION: Single intraoperative lateral view submitted for review after surgery.  Metallic probe projects over the clinoids. Transsphenoidal approach.   Electronically Signed   By: Chauncey Cruel M.D.   On: 04/25/2014  10:52    Assessment/Plan: We'll begin clear liquids, and reevaluate in a.m., and advance if doing well. Will begin to taper hydrocortisone IV drip, decreasing it now to 75 cc per hour. Continue strict I.'s and O.'s, continue specific gravities.   Hosie Spangle, MD 04/25/2014, 5:54 PM

## 2014-04-25 NOTE — Brief Op Note (Signed)
04/25/2014  10:43 AM  PATIENT:  Cindy Ellis  58 y.o. female  PRE-OPERATIVE DIAGNOSIS:  pituitary tumor  POST-OPERATIVE DIAGNOSIS:  pituitary tumor  PROCEDURE:  Procedure(s) with comments: CRANIOTOMY HYPOPHYSECTOMY TRANSNASAL APPROACH (N/A) - Transphenoidal resection of pituitary tumor Dr Wilburn Cornelia to approach TRANSNASAL APPROACH (N/A) - TRANSNASAL APPROACH  SURGEON:  Surgeon(s) and Role: Panel 1:    * Hosie Spangle, MD - Primary  Panel 2:    * Jerrell Belfast, MD - Primary  PHYSICIAN ASSISTANT:   ASSISTANTS: none   ANESTHESIA:   general  EBL:  Total I/O In: 1800 [I.V.:1800] Out: 330 [Urine:130; Blood:200]  BLOOD ADMINISTERED:none  DRAINS: none   LOCAL MEDICATIONS USED:  LIDOCAINE  and Amount: 7 ml  SPECIMEN:  Source of Specimen:  pituitary tumor  DISPOSITION OF SPECIMEN:  PATHOLOGY  COUNTS:  YES  TOURNIQUET:  * No tourniquets in log *  DICTATION: .Other Dictation: Dictation Number [id number]  PLAN OF CARE: Admit to inpatient   PATIENT DISPOSITION:  PACU - hemodynamically stable.   Delay start of Pharmacological VTE agent (>24hrs) due to surgical blood loss or risk of bleeding: no

## 2014-04-26 ENCOUNTER — Encounter (HOSPITAL_COMMUNITY): Payer: Self-pay | Admitting: Neurosurgery

## 2014-04-26 LAB — CBC WITH DIFFERENTIAL/PLATELET
Basophils Absolute: 0 10*3/uL (ref 0.0–0.1)
Basophils Relative: 0 % (ref 0–1)
Eosinophils Absolute: 0 10*3/uL (ref 0.0–0.7)
Eosinophils Relative: 0 % (ref 0–5)
HCT: 40 % (ref 36.0–46.0)
Hemoglobin: 12.9 g/dL (ref 12.0–15.0)
Lymphocytes Relative: 8 % — ABNORMAL LOW (ref 12–46)
Lymphs Abs: 1.1 10*3/uL (ref 0.7–4.0)
MCH: 29.6 pg (ref 26.0–34.0)
MCHC: 32.3 g/dL (ref 30.0–36.0)
MCV: 91.7 fL (ref 78.0–100.0)
Monocytes Absolute: 0.4 10*3/uL (ref 0.1–1.0)
Monocytes Relative: 3 % (ref 3–12)
Neutro Abs: 11.3 10*3/uL — ABNORMAL HIGH (ref 1.7–7.7)
Neutrophils Relative %: 89 % — ABNORMAL HIGH (ref 43–77)
Platelets: 259 10*3/uL (ref 150–400)
RBC: 4.36 MIL/uL (ref 3.87–5.11)
RDW: 13.5 % (ref 11.5–15.5)
WBC: 12.8 10*3/uL — ABNORMAL HIGH (ref 4.0–10.5)

## 2014-04-26 LAB — SODIUM
Sodium: 137 mEq/L (ref 137–147)
Sodium: 141 mEq/L (ref 137–147)
Sodium: 143 mEq/L (ref 137–147)

## 2014-04-26 LAB — BASIC METABOLIC PANEL
BUN: 12 mg/dL (ref 6–23)
CO2: 23 mEq/L (ref 19–32)
Calcium: 8.7 mg/dL (ref 8.4–10.5)
Chloride: 100 mEq/L (ref 96–112)
Creatinine, Ser: 0.71 mg/dL (ref 0.50–1.10)
GFR calc Af Amer: >90 mL/min (ref >90)
GFR calc non Af Amer: >90 mL/min (ref >90)
Glucose, Bld: 173 mg/dL — ABNORMAL HIGH (ref 70–99)
Potassium: 3.9 mEq/L (ref 3.7–5.3)
Sodium: 138 mEq/L (ref 137–147)

## 2014-04-26 MED ORDER — ATORVASTATIN CALCIUM 40 MG PO TABS
40.0000 mg | ORAL_TABLET | ORAL | Status: DC
  Administered 2014-04-26 – 2014-04-27 (×2): 40 mg via ORAL
  Filled 2014-04-26 (×3): qty 1

## 2014-04-26 MED ORDER — HYDROCORTISONE NA SUCCINATE PF 100 MG IJ SOLR
INTRAVENOUS | Status: AC
  Administered 2014-04-27: via INTRAVENOUS
  Filled 2014-04-26 (×2): qty 1000

## 2014-04-26 MED ORDER — AMOXICILLIN-POT CLAVULANATE 500-125 MG PO TABS: 1.0000 | ORAL_TABLET | Freq: Two times a day (BID) | ORAL | Status: DC

## 2014-04-26 MED ORDER — HYDROCORTISONE NA SUCCINATE PF 100 MG IJ SOLR
INTRAVENOUS | Status: DC
  Filled 2014-04-26: qty 1000

## 2014-04-26 MED ORDER — WHITE PETROLATUM GEL
Status: AC
  Administered 2014-04-26: 0.2
  Filled 2014-04-26: qty 5

## 2014-04-26 MED ORDER — HYDROCORTISONE 10 MG PO TABS
10.0000 mg | ORAL_TABLET | Freq: Every day | ORAL | Status: AC
  Administered 2014-04-27: 10 mg via ORAL
  Filled 2014-04-26: qty 1

## 2014-04-26 MED ORDER — PANTOPRAZOLE SODIUM 40 MG PO TBEC
40.0000 mg | DELAYED_RELEASE_TABLET | Freq: Every day | ORAL | Status: DC
  Administered 2014-04-26 – 2014-04-27 (×2): 40 mg via ORAL
  Filled 2014-04-26 (×2): qty 1

## 2014-04-26 MED ORDER — HYDROCORTISONE NA SUCCINATE PF 100 MG IJ SOLR
INTRAVENOUS | Status: AC
  Filled 2014-04-26: qty 1000

## 2014-04-26 MED ORDER — HYDROCORTISONE 20 MG PO TABS
20.0000 mg | ORAL_TABLET | Freq: Every day | ORAL | Status: DC
  Administered 2014-04-27: 20 mg via ORAL
  Filled 2014-04-26 (×2): qty 1

## 2014-04-26 NOTE — Progress Notes (Signed)
UR completed.  Trevor Wilkie, RN BSN [facility] CCM Trauma/Neuro ICU Case Manager [phone]  

## 2014-04-26 NOTE — Plan of Care (Signed)
Problem: Phase II Progression Outcomes Goal: Hemodynamically stable Outcome: Progressing Vital signs stable. Decrease VS checks to q2hrs.

## 2014-04-26 NOTE — Progress Notes (Signed)
   ENT Progress Note: POD # 1 s/p Procedure(s): CRANIOTOMY HYPOPHYSECTOMY TRANSNASAL APPROACH TRANSNASAL APPROACH   Subjective: Mild h/a, min d/c  Objective: Vital signs in last 24 hours: Temp:  [97.8 F (36.6 C)-98.1 F (36.7 C)] 98.1 F (36.7 C) (06/23 0800) Pulse Rate:  [52-80] 67 (06/23 1205) Resp:  [11-19] 15 (06/23 1205) BP: (128-167)/(51-77) 148/51 mmHg (06/23 1205) SpO2:  [91 %-99 %] 96 % (06/23 1205) Arterial Line BP: (79-213)/(62-101) 192/81 mmHg (06/23 1205) Weight change:  Last BM Date:  (PTA)  Intake/Output from previous day: 06/22 0701 - 06/23 0700 In: 5554.9 [P.O.:2052; I.V.:3402.9; IV Piggyback:100] Out: 5697 [Urine:5740; Blood:200] Intake/Output this shift: Total I/O In: 450 [I.V.:400; IV Piggyback:50] Out: 2600 [Urine:2600]  Labs:  Recent Labs  04/25/14 0603 04/26/14 0155  WBC 9.1 12.8*  HGB 14.0 12.9  HCT 43.9 40.0  PLT 256 259    Recent Labs  04/25/14 1245 04/26/14 0155 04/26/14 0735  NA 139 138 137  K 3.6* 3.9  --   CL 103 100  --   CO2 23 23  --   GLUCOSE 208* 173*  --   BUN 20 12  --   CALCIUM 8.8 8.7  --     Studies/Results: Dg Sella Turcica  04/25/2014   CLINICAL DATA:  Pituitary tumor resection  EXAM: SELLA TURCICA  COMPARISON:  MRI of the brain 03/22/2014.  FINDINGS: A single intraoperative view demonstrates a surgical probe at the level of the anterior clinoid within the sella turcica.  IMPRESSION: Intraoperative localization of the sella turcica.   Electronically Signed   By: Lawrence Santiago M.D.   On: 04/25/2014 10:48   Dg C-arm 1-60 Min  04/25/2014   CLINICAL DATA:  Pituitary tumor resection.  EXAM: DG C-ARM 61-120 MIN  FLUOROSCOPY TIME:  7 seconds  COMPARISON:  03/22/2014 MR  FINDINGS: Single intraoperative lateral view submitted for review after surgery.  Metallic probe projects over the clinoids. Transsphenoidal approach.  IMPRESSION: Single intraoperative lateral view submitted for review after surgery.  Metallic probe  projects over the clinoids. Transsphenoidal approach.   Electronically Signed   By: Chauncey Cruel M.D.   On: 04/25/2014 10:52     PHYSICAL EXAM: Packing in place Min d/c, no bleeding   Assessment/Plan: Pt stable POD#1 Cont current care Plan packing removal POD#7  If outpt plan office visit 6/29 9am  If inpt please call office and I will remove packing  On d/c cont saline spray and po antibiotics: Augmentin 500 bid for 10d    Cindy Ellis 04/26/2014, 12:42 PM

## 2014-04-26 NOTE — Progress Notes (Signed)
Updated Dr. Sherwood Gambler regarding patients urine output, specific gravity, and serum sodium level. Orders to continue to monitor and call if serum sodium is greater than or equal to 148. Will continue to monitor.

## 2014-04-26 NOTE — Progress Notes (Signed)
Paged Dr. Saintclair Halsted at [phone]. Dr. Saintclair Halsted returned page at [place]. Notified that patient's urine specific gravity was 1.0028 and patient had voided 215 ml in 30 mins and the urine was a clear color. Pt stating she is thirsty. Orders given to encourage patient to increase water intake. Orders also given to get a Na+ level and to call back if urine output was greater than 350 for two consecutive hours and if urine specific gravity was less than 1.005.  0200 urine output was greater than 350 for second consecutive hour. Stat Na+ level drawn. Waiting for results to page Dr. Saintclair Halsted.  [phone] Paged Dr. Saintclair Halsted. [phone] Dr. Saintclair Halsted returned page. Notified that patients Na+ level was 138, urine specific gravity was 1.0017, and urine output was greater than 350 for two consecutive hours. Orders given to increase maintenance fluids to 130ml/hr and recheck Na+ level q 6 hours from last Na+ draw.  Will continue to monitor urine output, urine specific gravity, and Na+ levels.

## 2014-04-26 NOTE — Op Note (Signed)
Cindy Ellis, Cindy Ellis NO.:  [id number]  MEDICAL RECORD NO.:  [id number]  LOCATION:  3M06C                        FACILITY:  [facility]  PHYSICIAN:  Early Chars. Wilburn Cornelia, M.D.DATE OF BIRTH:  1956/06/22  DATE OF PROCEDURE:  04/25/2014 DATE OF DISCHARGE:                              OPERATIVE REPORT   LOCATION:  [facility] Neurosurgical OR.  SURGEON:  Early Chars. Wilburn Cornelia, M.D.  NEUROSURGEON:  Hosie Spangle, M.D.  PREOPERATIVE DIAGNOSIS:  Pituitary mass with visual changes.  POSTOPERATIVE DIAGNOSIS:  Pituitary mass with visual changes.  INDICATION FOR SURGERY:  Pituitary mass with visual changes.  SURGICAL PROCEDURE:  Transseptal, trans-sphenoidal approach to pituitary tumor with reconstruction.  ANESTHESIA:  General endotracheal.  COMPLICATIONS:  None.  ESTIMATED BLOOD LOSS:  Less than 50 mL.  DISPOSITION:  The patient was transferred from operating room to the recovery room in stable condition.  Please note, the neurosurgical component of the surgical procedure is dictated as a separate operative note by Dr. Sherwood Gambler.  BRIEF HISTORY:  The patient is a 58 year old black female, who had significant visual changes, initially diagnosed by her ophthalmologist. The patient was noted to have bilateral hemianopsia with visual field defects, consistent with possible pituitary tumor.  MRI scan performed showed a large cystic-appearing pituitary mass.  Given the patient's history and findings; she was evaluated, worked up, and scheduled for resection of pituitary tumor via transsphenoidal approach.  The patient was seen preoperatively, scans reviewed, and the risks and benefits of the approach and reconstructive procedure were discussed in detail. Given the patient's history, examination, and findings; the above surgical procedure was scheduled on elective basis at [facility] Neurosurgical OR.  DESCRIPTION OF PROCEDURE:  The patient was  brought to the operating room on April 25, 2014, and placed in supine position on the operating table. General endotracheal anesthesia was established without difficulty.  The patient was adequately anesthetized, she was positioned on the operating table, prepped and draped in sterile fashion.  Her nose was then injected with 7 mL of 1% lidocaine 1:100,000 solution epinephrine, which was injected in submucosal fashion along the nasal septum bilaterally and also injected was the anterior subcutaneous nasal columella and floor of the nose.  Her nose was then packed with Afrin-soaked cotton pledgets and were left in place for approximately 10 minutes for vasoconstriction and hemostasis.  The patient was prepped and draped.  Procedure was begun by creating a left anterior hemitransfixion incision and this was carried through the mucosa and underlying submucosa and a mucoperichondrial flap was elevated from anterior to posterior along the left-hand side of the nasal septum.  Anterior cartilaginous septum was crossed at the midline and mucoperichondrial flap was elevated on the right-hand side exposing the entire nasal septum from the anterior nasal passage way to the anterior face of the sphenoid.  The mid septal cartilage was removed. This was later morselized and returned to the mucoperichondrial pocket for reconstructive purposes.  Dissected from anterior to posterior nasal septal bone and cartilage were resected to the level of the anterior face of the sphenoid sinus.  With the septal component of the procedure completed, the anterior columellar incision was demarcated  on the skin. Using a #15 scalpel, a modified V shape incision was created in the anterior nasal skin.  This was carried through the columella including the lower lateral cartilage and nasal septum at its attachment to the maxillary crest.  The septoplasty incisions were lengthened across the floor of the nasal passageway  primarily on the left, and the nasal septum and anterior columella were reflected to the right, allowing placement of the Halsted nasal septal retractors.  The operating microscope was brought into position.  The space of the sphenoid was confirmed with C-arm x-ray in the anterior aspect of the sphenoid sinus was then resected with a 4 mm osteotome, removing the bone between the natural ostia of the sinuses bilaterally.  Sinus mucosa was then carefully stripped away and the bony sphenoid sinus septum was resected. This allowed direct access to the sphenoid sinus and the anterior aspect of the pituitary tumor location was confirmed with C-arm and adequate exposure was confirmed and the margins of the tumor were carefully demarcated.  At this point, the procedure, Dr. Sherwood Gambler, who had been present throughout this portion of the surgical procedure stepped in to perform the resection of the pituitary mass.  This was dictated as a separate operative report.  When the resection was complete and reconstruction of the anterior face of the pituitary fossa was completed.  Reconstruction of the nasal septum was undertaken.  This consisted of replacement of the patient's resected septal cartilage, which was then morselized and returned to the mucoperichondrial pocket.  The anterior columella and inferior attachment of the nasal septum to the maxillary crest was reapproximated with a 4-0 PDS suture in interrupted fashion as a permanent anchoring stitch.  The mucosal flaps were then reapproximated with a 4-0 gut suture on a Keith needle in a horizontal mattress fashion and the anterior columellar mucosal incisions were closed with the same stitch coapting the septal flaps and closing the intranasal incisions.  The anterior aspect was then reconstructed with interrupted 4-0 Vicryl suture followed by 6-0 Ethilon suture at the skin edge to approximate the skin margin.  Bilateral Doyle nasal septal  splints were then placed after the application of Bactroban ointment and sutured in position with a 3-0 Ethilon suture.  Bilateral Merocel sponges were then placed in each nostril after the application of Bactroban ointment and hydrated with sterile saline.  The patient's oral cavity oropharynx were irrigated and suctioned. There was no active bleeding.  An orogastric tube was then passed, the stomach contents were aspirated.  The patient was then awakened from anesthetic.  She was extubated and was transferred to the operating room to the recovery room in stable condition.  There were no complications.          ______________________________ Early Chars Wilburn Cornelia, M.D.     DLS/MEDQ  D:  50/35/4656  T:  04/26/2014  Job:  [id number]  cc:   Cindy Levering. Talbert Forest, MD

## 2014-04-26 NOTE — Progress Notes (Signed)
Subjective: Patient was continued headache and nasal pain, responding to Norco and morphine when necessary. Urine output increased overnight, with decreasing specific gravity, however serum sodium at 0100 was 139, and at 0800 was 137. Patient keeping up with by mouth fluid intake, but does describe some thirst. No nausea currently. Would like to advance to a regular diet. Notes that the vision is improved as compared to prior surgery.  Objective: Vital signs in last 24 hours: Filed Vitals:   04/26/14 0600 04/26/14 0700 04/26/14 0738 04/26/14 0800  BP: 148/65 160/73 150/61 167/64  Pulse: 52 56 56 59  Temp:    98.1 F (36.7 C)  TempSrc:    Axillary  Resp: 11 16 17 13   Height:      SpO2: 95% 93% 94% 99%    Intake/Output from previous day: 06/22 0701 - 06/23 0700 In: 5554.9 [P.O.:2052; I.V.:3402.9; IV Piggyback:100] Out: 9373 [Urine:5740; Blood:200] Intake/Output this shift: Total I/O In: -  Out: 1225 [Urine:1225]  Physical Exam:  Awake and alert, oriented. Following commands. Moving all 4 extremities well. EOMI.  CBC  Recent Labs  04/25/14 0603 04/26/14 0155  WBC 9.1 12.8*  HGB 14.0 12.9  HCT 43.9 40.0  PLT 256 259   BMET  Recent Labs  04/25/14 1245 04/26/14 0155 04/26/14 0735  NA 139 138 137  K 3.6* 3.9  --   CL 103 100  --   CO2 23 23  --   GLUCOSE 208* 173*  --   BUN 20 12  --   CREATININE 0.80 0.71  --   CALCIUM 8.8 8.7  --     Assessment/Plan: Although urine output increased and specific gravity decreased, serum sodium remains normal. We'll continue to monitor strict I.'s and O.'s, specific gravities, and serum sodiums, but will hold off on DDAVP unless serum sodium increases significantly. Patient continues to require a Foley catheter for strict I's and O's.  We'll advance to soft regular diet. Begin out of bed to chair, and advance to ambulation.  We'll begin to taper hydrocortisone drip, we'll plan to add by mouth hydrocortisone in a.m. and  discontinue drip.   Hosie Spangle, MD 04/26/2014, 8:19 AM

## 2014-04-26 NOTE — Plan of Care (Signed)
Problem: Phase II Progression Outcomes Goal: Progress activity as tolerated unless otherwise ordered Outcome: Progressing Pt up in chair now- will continue to progress activity to ambulation later today.

## 2014-04-26 NOTE — Progress Notes (Signed)
Inpatient Diabetes Program Recommendations  AACE/ADA: New Consensus Statement on Inpatient Glycemic Control (2013)  Target Ranges:  Prepandial:   less than 140 mg/dL      Peak postprandial:   less than 180 mg/dL (1-2 hours)      Critically ill patients:  140 - 180 mg/dL      Results for SKI, POLICH (MRN [id number]) as of 04/26/2014 07:58  Ref. Range 04/25/2014 12:45 04/26/2014 01:55  Glucose Latest Range: 70-99 mg/dL 208 (H) 173 (H)     Patient currently receiving D5 1/2 NS IVF with Solucortef.  Having occasional glucose elevations.    MD- If patient continues to have glucose elevations while on IV steroids, Please consider adding Novolog Sensitive SSI   Will follow Wyn Quaker RN, MSN, CDE Diabetes Coordinator Inpatient Diabetes Program Team Pager: [phone] (8a-10p)

## 2014-04-27 LAB — SODIUM
Sodium: 140 mEq/L (ref 137–147)
Sodium: 142 mEq/L (ref 137–147)
Sodium: 144 mEq/L (ref 137–147)

## 2014-04-27 MED ORDER — HYDROCORTISONE 10 MG PO TABS
10.0000 mg | ORAL_TABLET | Freq: Once | ORAL | Status: AC
  Administered 2014-04-28: 10 mg via ORAL
  Filled 2014-04-27: qty 1

## 2014-04-27 MED ORDER — HYDROCORTISONE 5 MG PO TABS
5.0000 mg | ORAL_TABLET | Freq: Once | ORAL | Status: DC
  Filled 2014-04-27: qty 1

## 2014-04-27 MED ORDER — CEPHALEXIN 500 MG PO CAPS
500.0000 mg | ORAL_CAPSULE | Freq: Three times a day (TID) | ORAL | Status: DC
  Administered 2014-04-28 (×2): 500 mg via ORAL
  Filled 2014-04-27 (×4): qty 1

## 2014-04-27 NOTE — Plan of Care (Signed)
Problem: Phase III Progression Outcomes Goal: Tolerating diet Outcome: Completed/Met Date Met:  04/27/14 Tolerating regular diet with no nausea. Eating 100% of meals.

## 2014-04-27 NOTE — Plan of Care (Signed)
Problem: Consults Goal: Nutrition Consult-if indicated Outcome: Not Applicable Date Met:  16/94/50 Does not need

## 2014-04-27 NOTE — Plan of Care (Signed)
Problem: Phase II Progression Outcomes Goal: Tolerating diet Outcome: Completed/Met Date Met:  04/27/14 Pt tolerating diet without nausea     

## 2014-04-27 NOTE — Plan of Care (Signed)
Problem: Phase III Progression Outcomes Goal: Neuro exam at baseline or improved Outcome: Completed/Met Date Met:  04/27/14 Neuro checks q4hrs.

## 2014-04-27 NOTE — Progress Notes (Signed)
Subjective: Patient reports that she's continued to steadily improve, and is doing well. Pain and headache much improved, being managed at this point with Norco when necessary. Taking well PO.  Less thirst.  Serum sodium has remained stable, urine output stabilizing, specific gravities improved, urine does have a yellow tinge. Patient reports vision remains significantly improved as compared to prior to surgery. Has tolerated tapering of hydrocortisone drip, will start by mouth hydrocortisone this morning, and DC hydrocortisone drip about an hour after first by mouth hydrocortisone dose.   Objective: Vital signs in last 24 hours: Filed Vitals:   04/27/14 0359 04/27/14 0400 04/27/14 0600 04/27/14 0800  BP:  137/58 135/69 139/69  Pulse:  53 49 51  Temp: 97.7 F (36.5 C)     TempSrc: Axillary     Resp:  14 10 13   Height:      Weight:      SpO2:  98% 99% 98%    Intake/Output from previous day: 06/23 0701 - 06/24 0700 In: 4441.3 [P.O.:3470; I.V.:921.3; IV Piggyback:50] Out: 7000 [Urine:7000] Intake/Output this shift: Total I/O In: 70 [I.V.:20; IV Piggyback:50] Out: 255 [Urine:255]  Physical Exam:  Awake and alert, oriented. Following commands. Moving all 4 extremities well. EOMI.  CBC  Recent Labs  04/25/14 0603 04/26/14 0155  WBC 9.1 12.8*  HGB 14.0 12.9  HCT 43.9 40.0  PLT 256 259   BMET  Recent Labs  04/25/14 1245 04/26/14 0155  04/27/14 0205 04/27/14 0750  NA 139 138  < > 140 142  K 3.6* 3.9  --   --   --   CL 103 100  --   --   --   CO2 23 23  --   --   --   GLUCOSE 208* 173*  --   --   --   BUN 20 12  --   --   --   CREATININE 0.80 0.71  --   --   --   CALCIUM 8.8 8.7  --   --   --   < > = values in this interval not displayed.  Assessment/Plan: Continue to stabilize. We'll check shows sodium again at 2000, and recheck BMET in a.m. Will DC Rocephin after this morning that there was started on Keflex by mouth. We'll taper hydrocortisone starting tomorrow.  We'll DC arterial line, and if urine output and specific gravities remained stable, will DC Foley in a.m.  Encouraged ambulation at least 4 times in the halls of the ICU, with nursing staff today.   Hosie Spangle, MD 04/27/2014, 9:05 AM

## 2014-04-27 NOTE — Progress Notes (Signed)
Dr. Sherwood Gambler ordered to discontinue telemetry. VS q4hrs. Notified E-link that the patient will be off the monitor. Will continue to monitor.

## 2014-04-27 NOTE — Plan of Care (Signed)
Problem: Phase II Progression Outcomes Goal: Hemodynamically stable Outcome: Completed/Met Date Met:  04/27/14 Orders to discontinue telemetry- VS stable

## 2014-04-28 LAB — BASIC METABOLIC PANEL
BUN: 13 mg/dL (ref 6–23)
CO2: 27 mEq/L (ref 19–32)
Calcium: 8.9 mg/dL (ref 8.4–10.5)
Chloride: 107 mEq/L (ref 96–112)
Creatinine, Ser: 0.84 mg/dL (ref 0.50–1.10)
GFR calc Af Amer: 86 mL/min — ABNORMAL LOW (ref >90)
GFR calc non Af Amer: 75 mL/min — ABNORMAL LOW (ref >90)
Glucose, Bld: 94 mg/dL (ref 70–99)
Potassium: 4.2 mEq/L (ref 3.7–5.3)
Sodium: 144 mEq/L (ref 137–147)

## 2014-04-28 MED ORDER — CEPHALEXIN 500 MG PO CAPS: 500.0000 mg | ORAL_CAPSULE | Freq: Three times a day (TID) | ORAL | Status: DC

## 2014-04-28 MED ORDER — HYDROCODONE-ACETAMINOPHEN 5-325 MG PO TABS: 1.0000 | ORAL_TABLET | ORAL | Status: DC | PRN

## 2014-04-28 NOTE — Discharge Summary (Signed)
Physician Discharge Summary  Patient ID: Cindy Ellis MRN: [id number] DOB/AGE: [birth year] 60 y.o.  Admit date: 04/25/2014 Discharge date: 04/28/2014  Admission Diagnoses:  pituitary tumor, bitemporal hemianopsia  Discharge Diagnoses:  pituitary tumor, bitemporal hemianopsia  Active Problems:   Pituitary adenoma with extrasellar extension   Discharged Condition: good  Hospital Course: Patient was admitted for treatment of a pituitary tumor with bitemporal hemianopsia. She underwent a trans-septal, transsphenoidal resection of pituitary macroadenoma in a co-surgeon fashion between myself and Dr. Jerrell Belfast. Postoperatively she has been cared for in the neurosurgical intensive care unit, it is made steady progress. She noted significant improvement in her vision within the first 24 hours. 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 She never required DDAVP, but rather was able to keep up with her thirst or drinking. She was supported with a tapering hydrocortisone drip and then tapering by mouth hydrocortisone. Her diabetes insipidus has resolved, and we will DC her Foley now, and when she has voided on her own we will discharge her to home. She's been up and ambulating actively in the halls of the ICU, and we've encouraged to be ambulating outside of fresh air at least a couple times a day. She is being discharged home on Keflex 500 mg 3 times a day, which she will continue to Dr. Wilburn Cornelia moves her nasal packings on June 29. She is to see Dr. Wilburn Cornelia that day at 0900. She is to return for followup with me in 3 weeks. Overall she had a fair amount of pain on the initial postoperative day, but has steadily decreased, and is now being managed well by Norco 5/325, for which she's been given a prescription. She's been given instructions regarding  activities at home, and appropriate limitations.  Discharge Exam: Blood pressure 133/68, pulse 58, temperature 97.4 F (36.3 C), temperature source Axillary, resp. rate 13, height 5' 5.5" (1.664 m), weight 132.2 kg (291 lb 7.2 oz), SpO2 98.00%.  Disposition: home     Medication List         amLODipine 5 MG tablet  Commonly known as:  NORVASC  Take 5 mg by mouth daily.     amoxicillin-clavulanate 500-125 MG per tablet  Commonly known as:  AUGMENTIN  Take 1 tablet (500 mg total) by mouth 2 (two) times daily.     atenolol 25 MG tablet  Commonly known as:  TENORMIN  Take 25 mg by mouth daily.     cephALEXin 500 MG capsule  Commonly known as:  KEFLEX  Take 1 capsule (500 mg total) by mouth every 8 (eight) hours.     ferrous sulfate 325 (65 FE) MG tablet  Take 325 mg by mouth daily with breakfast.     FISH OIL EXTRA STRENGTH 435 MG Caps  Take 1 capsule by mouth daily.     Garlic 10 MG Caps  Take 10 mg by mouth daily.     HYDROcodone-acetaminophen 5-325 MG per tablet  Commonly known as:  NORCO/VICODIN  Take 1-2 tablets by mouth every 4 (four) hours as needed for moderate pain or severe pain.     LIPITOR 40 MG tablet  Generic drug:  atorvastatin  Take 40 mg by mouth daily.     lisinopril 40 MG tablet  Commonly known as:  PRINIVIL,ZESTRIL  Take 40 mg by mouth daily.     MAXZIDE-25 37.5-25 MG per  tablet  Generic drug:  triamterene-hydrochlorothiazide  Take 1 tablet by mouth daily.     SM MULTIPLE VITAMINS/IRON Tabs  Take 1 tablet by mouth daily.     vitamin B-12 500 MCG tablet  Commonly known as:  CYANOCOBALAMIN  Take 500 mcg by mouth daily.     Vitamin D3 2000 UNITS Tabs  Take 1 tablet by mouth daily.         Signed: Hosie Spangle, MD 04/28/2014, 9:05 AM

## 2014-04-28 NOTE — Discharge Instructions (Signed)
No driving for 2 weeks (may ride in car with someone else driving) No bending over or lifting heavy objects for 2 weeks May use gauze pad as needed for nasal drip May take shower at home

## 2014-05-02 ENCOUNTER — Other Ambulatory Visit: Payer: Self-pay | Admitting: Internal Medicine

## 2014-05-02 DIAGNOSIS — E049 Nontoxic goiter, unspecified: Secondary | ICD-10-CM

## 2014-05-05 ENCOUNTER — Ambulatory Visit
Admission: RE | Admit: 2014-05-05 | Discharge: 2014-05-05 | Disposition: A | Discharge status: Home / Self Care | Payer: BC Managed Care – PPO | Source: Ambulatory Visit | Attending: Internal Medicine | Admitting: Internal Medicine

## 2014-05-05 DIAGNOSIS — E049 Nontoxic goiter, unspecified: Secondary | ICD-10-CM

## 2014-05-09 ENCOUNTER — Other Ambulatory Visit: Payer: Self-pay | Admitting: Internal Medicine

## 2014-05-09 DIAGNOSIS — E041 Nontoxic single thyroid nodule: Secondary | ICD-10-CM

## 2014-05-11 ENCOUNTER — Ambulatory Visit
Admission: RE | Admit: 2014-05-11 | Discharge: 2014-05-11 | Disposition: A | Discharge status: Home / Self Care | Payer: BC Managed Care – PPO | Source: Ambulatory Visit | Attending: Internal Medicine | Admitting: Internal Medicine

## 2014-05-11 ENCOUNTER — Other Ambulatory Visit (HOSPITAL_COMMUNITY)
Admission: RE | Admit: 2014-05-11 | Discharge: 2014-05-11 | Disposition: A | Discharge status: Home / Self Care | Payer: BC Managed Care – PPO | Source: Ambulatory Visit | Attending: Interventional Radiology | Admitting: Interventional Radiology

## 2014-05-11 DIAGNOSIS — E041 Nontoxic single thyroid nodule: Secondary | ICD-10-CM | POA: Insufficient documentation

## 2014-08-29 ENCOUNTER — Other Ambulatory Visit: Payer: Self-pay | Admitting: Otolaryngology

## 2014-08-29 DIAGNOSIS — D440 Neoplasm of uncertain behavior of thyroid gland: Secondary | ICD-10-CM

## 2014-09-16 ENCOUNTER — Ambulatory Visit
Admission: RE | Admit: 2014-09-16 | Discharge: 2014-09-16 | Disposition: A | Discharge status: Home / Self Care | Payer: BC Managed Care – PPO | Source: Ambulatory Visit | Attending: Otolaryngology | Admitting: Otolaryngology

## 2014-09-16 DIAGNOSIS — D440 Neoplasm of uncertain behavior of thyroid gland: Secondary | ICD-10-CM

## 2014-09-16 MED ORDER — IOHEXOL 300 MG/ML  SOLN
100.0000 mL | Freq: Once | INTRAMUSCULAR | Status: AC | PRN
  Administered 2014-09-16: 100 mL via INTRAVENOUS

## 2014-10-05 ENCOUNTER — Encounter (HOSPITAL_COMMUNITY): Payer: Self-pay | Admitting: Pharmacy Technician

## 2014-10-05 ENCOUNTER — Other Ambulatory Visit: Payer: Self-pay | Admitting: Otolaryngology

## 2014-10-05 NOTE — Pre-Procedure Instructions (Signed)
Cindy Ellis  10/05/2014   Your procedure is scheduled on:  Wednesday, December 9th   Report to [facility] Admitting at  9:30 AM.   Call this number if you have problems the morning of surgery: [phone]   Remember:   Do not eat food or drink liquids after midnight Tuesday.   Take these medicines the morning of surgery with A SIP OF WATER: Norvasc, Atenolol, Hydrocodone   Do not wear jewelry, make-up or nail polish.  Do not wear lotions, powders, or perfumes. You may NOT wear deodorant.  Do not shave underarms & legs 48 hours prior to surgery.   Do not bring valuables to the hospital.  [facility] is not responsible for any belongings or valuables.               Contacts, dentures or bridgework may not be worn into surgery.  Leave suitcase in the car. After surgery it may be brought to your room.  For patients admitted to the hospital, discharge time is determined by your treatment team.               Name and phone number of your driver:    Special Instructions: "Preparing for Surgery" instruction sheet.   Please read over the following fact sheets that you were given: Pain Booklet, Coughing and Deep Breathing and Surgical Site Infection Prevention

## 2014-10-06 ENCOUNTER — Encounter (HOSPITAL_COMMUNITY): Payer: Self-pay

## 2014-10-06 ENCOUNTER — Encounter (HOSPITAL_COMMUNITY)
Admission: RE | Admit: 2014-10-06 | Discharge: 2014-10-06 | Disposition: A | Discharge status: Home / Self Care | Payer: BC Managed Care – PPO | Source: Ambulatory Visit | Attending: Otolaryngology | Admitting: Otolaryngology

## 2014-10-06 DIAGNOSIS — Z01812 Encounter for preprocedural laboratory examination: Secondary | ICD-10-CM | POA: Diagnosis not present

## 2014-10-06 LAB — BASIC METABOLIC PANEL
ANION GAP: 13 (ref 5–15)
BUN: 17 mg/dL (ref 6–23)
CHLORIDE: 105 meq/L (ref 96–112)
CO2: 24 mEq/L (ref 19–32)
Calcium: 10 mg/dL (ref 8.4–10.5)
Creatinine, Ser: 0.69 mg/dL (ref 0.50–1.10)
GFR calc Af Amer: >90 mL/min (ref >90)
Glucose, Bld: 103 mg/dL — ABNORMAL HIGH (ref 70–99)
POTASSIUM: 4.1 meq/L (ref 3.7–5.3)
Sodium: 142 mEq/L (ref 137–147)

## 2014-10-06 LAB — CBC
HCT: 43.8 % (ref 36.0–46.0)
HEMOGLOBIN: 13.9 g/dL (ref 12.0–15.0)
MCH: 28.5 pg (ref 26.0–34.0)
MCHC: 31.7 g/dL (ref 30.0–36.0)
MCV: 89.8 fL (ref 78.0–100.0)
Platelets: 239 10*3/uL (ref 150–400)
RBC: 4.88 MIL/uL (ref 3.87–5.11)
RDW: 14.2 % (ref 11.5–15.5)
WBC: 6.3 10*3/uL (ref 4.0–10.5)

## 2014-10-06 NOTE — Progress Notes (Signed)
   10/06/14 0908  OBSTRUCTIVE SLEEP APNEA  Have you ever been diagnosed with sleep apnea through a sleep study? No  Do you snore loudly (loud enough to be heard through closed doors)?  0  Do you often feel tired, fatigued, or sleepy during the daytime? 1  Has anyone observed you stop breathing during your sleep? 0  Do you have, or are you being treated for high blood pressure? 1  BMI more than 35 kg/m2? 1  Age over 59 years old? 1  Neck circumference greater than 40 cm/16 inches? 1  Gender: 0  Obstructive Sleep Apnea Score 5  Score 4 or greater  Results sent to PCP

## 2014-10-11 MED ORDER — DEXTROSE 5 % IV SOLN
2.0000 g | INTRAVENOUS | Status: AC
  Administered 2014-10-12: 2 g via INTRAVENOUS
  Filled 2014-10-11: qty 2

## 2014-10-11 MED ORDER — DEXAMETHASONE SODIUM PHOSPHATE 10 MG/ML IJ SOLN
10.0000 mg | Freq: Once | INTRAMUSCULAR | Status: AC
  Administered 2014-10-12: 10 mg via INTRAVENOUS
  Filled 2014-10-11: qty 1

## 2014-10-12 ENCOUNTER — Observation Stay (HOSPITAL_COMMUNITY)
Admission: RE | Admit: 2014-10-12 | Discharge: 2014-10-13 | Disposition: A | Discharge status: Home / Self Care | Payer: BC Managed Care – PPO | Source: Ambulatory Visit | Attending: Otolaryngology | Admitting: Otolaryngology

## 2014-10-12 ENCOUNTER — Ambulatory Visit (HOSPITAL_COMMUNITY): Payer: BC Managed Care – PPO | Admitting: Vascular Surgery

## 2014-10-12 ENCOUNTER — Ambulatory Visit (HOSPITAL_COMMUNITY): Payer: BC Managed Care – PPO | Admitting: Anesthesiology

## 2014-10-12 ENCOUNTER — Encounter (HOSPITAL_COMMUNITY): Payer: Self-pay | Admitting: Otolaryngology

## 2014-10-12 ENCOUNTER — Encounter (HOSPITAL_COMMUNITY)
Admission: RE | Disposition: A | Discharge status: Home / Self Care | Payer: Self-pay | Source: Ambulatory Visit | Attending: Otolaryngology

## 2014-10-12 DIAGNOSIS — Z86010 Personal history of colonic polyps: Secondary | ICD-10-CM | POA: Diagnosis not present

## 2014-10-12 DIAGNOSIS — D649 Anemia, unspecified: Secondary | ICD-10-CM | POA: Insufficient documentation

## 2014-10-12 DIAGNOSIS — Z79899 Other long term (current) drug therapy: Secondary | ICD-10-CM | POA: Insufficient documentation

## 2014-10-12 DIAGNOSIS — E079 Disorder of thyroid, unspecified: Secondary | ICD-10-CM | POA: Diagnosis present

## 2014-10-12 DIAGNOSIS — Z419 Encounter for procedure for purposes other than remedying health state, unspecified: Secondary | ICD-10-CM

## 2014-10-12 DIAGNOSIS — I10 Essential (primary) hypertension: Secondary | ICD-10-CM | POA: Insufficient documentation

## 2014-10-12 DIAGNOSIS — C73 Malignant neoplasm of thyroid gland: Principal | ICD-10-CM | POA: Insufficient documentation

## 2014-10-12 HISTORY — PX: THYROIDECTOMY, PARTIAL: SHX18

## 2014-10-12 HISTORY — DX: Iron deficiency anemia, unspecified: D50.9

## 2014-10-12 HISTORY — PX: THYROIDECTOMY: SHX17

## 2014-10-12 HISTORY — DX: Neoplasm of unspecified behavior of endocrine glands and other parts of nervous system: D49.7

## 2014-10-12 LAB — BASIC METABOLIC PANEL
Anion gap: 14 (ref 5–15)
BUN: 16 mg/dL (ref 6–23)
CHLORIDE: 104 meq/L (ref 96–112)
CO2: 23 meq/L (ref 19–32)
Calcium: 9.8 mg/dL (ref 8.4–10.5)
Creatinine, Ser: 0.76 mg/dL (ref 0.50–1.10)
GFR calc non Af Amer: 90 mL/min — ABNORMAL LOW (ref >90)
GLUCOSE: 92 mg/dL (ref 70–99)
POTASSIUM: 3.7 meq/L (ref 3.7–5.3)
Sodium: 141 mEq/L (ref 137–147)

## 2014-10-12 LAB — CBC
HEMATOCRIT: 42.6 % (ref 36.0–46.0)
HEMOGLOBIN: 13.7 g/dL (ref 12.0–15.0)
MCH: 28.7 pg (ref 26.0–34.0)
MCHC: 32.2 g/dL (ref 30.0–36.0)
MCV: 89.1 fL (ref 78.0–100.0)
Platelets: 234 10*3/uL (ref 150–400)
RBC: 4.78 MIL/uL (ref 3.87–5.11)
RDW: 14.1 % (ref 11.5–15.5)
WBC: 6.5 10*3/uL (ref 4.0–10.5)

## 2014-10-12 SURGERY — THYROIDECTOMY
Anesthesia: General | Laterality: Right

## 2014-10-12 MED ORDER — FENTANYL CITRATE 0.05 MG/ML IJ SOLN
INTRAMUSCULAR | Status: DC | PRN
  Administered 2014-10-12 (×2): 100 ug via INTRAVENOUS
  Administered 2014-10-12: 50 ug via INTRAVENOUS

## 2014-10-12 MED ORDER — BACITRACIN ZINC 500 UNIT/GM EX OINT
TOPICAL_OINTMENT | CUTANEOUS | Status: AC
  Filled 2014-10-12: qty 15

## 2014-10-12 MED ORDER — PROPOFOL 10 MG/ML IV BOLUS
INTRAVENOUS | Status: DC | PRN
  Administered 2014-10-12: 50 mg via INTRAVENOUS

## 2014-10-12 MED ORDER — LEVOTHYROXINE SODIUM 75 MCG PO TABS
75.0000 ug | ORAL_TABLET | Freq: Every day | ORAL | Status: DC
  Administered 2014-10-13: 75 ug via ORAL
  Filled 2014-10-12 (×2): qty 1

## 2014-10-12 MED ORDER — HYDROCODONE-ACETAMINOPHEN 5-325 MG PO TABS
1.0000 | ORAL_TABLET | Freq: Four times a day (QID) | ORAL | Status: DC | PRN
Start: 2014-10-12 — End: 2015-12-25

## 2014-10-12 MED ORDER — PROPOFOL 10 MG/ML IV BOLUS
INTRAVENOUS | Status: AC
  Filled 2014-10-12: qty 20

## 2014-10-12 MED ORDER — LIDOCAINE-EPINEPHRINE 1 %-1:100000 IJ SOLN
INTRAMUSCULAR | Status: DC | PRN
  Administered 2014-10-12: 20 mL

## 2014-10-12 MED ORDER — LIDOCAINE HCL (CARDIAC) 20 MG/ML IV SOLN
INTRAVENOUS | Status: DC | PRN
  Administered 2014-10-12: 50 mg via INTRAVENOUS

## 2014-10-12 MED ORDER — ONDANSETRON HCL 4 MG/2ML IJ SOLN: 4.0000 mg | INTRAMUSCULAR | Status: DC | PRN

## 2014-10-12 MED ORDER — GLYCOPYRROLATE 0.2 MG/ML IJ SOLN
INTRAMUSCULAR | Status: AC
  Filled 2014-10-12: qty 1

## 2014-10-12 MED ORDER — EPHEDRINE SULFATE 50 MG/ML IJ SOLN
INTRAMUSCULAR | Status: AC
Start: 2014-10-12 — End: 2014-10-12
  Filled 2014-10-12: qty 1

## 2014-10-12 MED ORDER — OXYCODONE HCL 5 MG/5ML PO SOLN
5.0000 mg | Freq: Once | ORAL | Status: AC | PRN
  Administered 2014-10-12: 5 mg via ORAL

## 2014-10-12 MED ORDER — HYDROMORPHONE HCL 1 MG/ML IJ SOLN
INTRAMUSCULAR | Status: AC
  Filled 2014-10-12: qty 1

## 2014-10-12 MED ORDER — HYDROCODONE-ACETAMINOPHEN 5-325 MG PO TABS: 1.0000 | ORAL_TABLET | ORAL | Status: DC | PRN

## 2014-10-12 MED ORDER — FENTANYL CITRATE 0.05 MG/ML IJ SOLN
INTRAMUSCULAR | Status: AC
  Filled 2014-10-12: qty 5

## 2014-10-12 MED ORDER — HEPARIN SODIUM (PORCINE) 5000 UNIT/ML IJ SOLN
5000.0000 [IU] | Freq: Three times a day (TID) | INTRAMUSCULAR | Status: DC
  Administered 2014-10-12 – 2014-10-13 (×3): 5000 [IU] via SUBCUTANEOUS
  Filled 2014-10-12 (×4): qty 1

## 2014-10-12 MED ORDER — ONDANSETRON HCL 4 MG/2ML IJ SOLN
INTRAMUSCULAR | Status: AC
  Filled 2014-10-12: qty 2

## 2014-10-12 MED ORDER — ONDANSETRON HCL 4 MG PO TABS: 4.0000 mg | ORAL_TABLET | ORAL | Status: DC | PRN

## 2014-10-12 MED ORDER — LIDOCAINE-EPINEPHRINE 1 %-1:100000 IJ SOLN
INTRAMUSCULAR | Status: AC
  Filled 2014-10-12: qty 1

## 2014-10-12 MED ORDER — LISINOPRIL 40 MG PO TABS
40.0000 mg | ORAL_TABLET | Freq: Every day | ORAL | Status: DC
  Administered 2014-10-12 – 2014-10-13 (×2): 40 mg via ORAL
  Filled 2014-10-12: qty 1
  Filled 2014-10-12: qty 2
  Filled 2014-10-12: qty 1

## 2014-10-12 MED ORDER — SUCCINYLCHOLINE CHLORIDE 20 MG/ML IJ SOLN
INTRAMUSCULAR | Status: DC | PRN
  Administered 2014-10-12: 140 mg via INTRAVENOUS

## 2014-10-12 MED ORDER — ATORVASTATIN CALCIUM 40 MG PO TABS
40.0000 mg | ORAL_TABLET | Freq: Every day | ORAL | Status: DC
  Administered 2014-10-12: 40 mg via ORAL
  Filled 2014-10-12 (×2): qty 1

## 2014-10-12 MED ORDER — ATENOLOL 25 MG PO TABS
25.0000 mg | ORAL_TABLET | Freq: Every day | ORAL | Status: DC
  Administered 2014-10-13: 25 mg via ORAL
  Filled 2014-10-12 (×2): qty 1

## 2014-10-12 MED ORDER — 0.9 % SODIUM CHLORIDE (POUR BTL) OPTIME
TOPICAL | Status: DC | PRN
  Administered 2014-10-12: 1000 mL

## 2014-10-12 MED ORDER — PROMETHAZINE HCL 25 MG/ML IJ SOLN: 6.2500 mg | INTRAMUSCULAR | Status: DC | PRN

## 2014-10-12 MED ORDER — MIDAZOLAM HCL 5 MG/5ML IJ SOLN
INTRAMUSCULAR | Status: DC | PRN
  Administered 2014-10-12: 2 mg via INTRAVENOUS

## 2014-10-12 MED ORDER — SODIUM CHLORIDE 0.9 % IJ SOLN
INTRAMUSCULAR | Status: AC
  Filled 2014-10-12: qty 10

## 2014-10-12 MED ORDER — ARTIFICIAL TEARS OP OINT
TOPICAL_OINTMENT | OPHTHALMIC | Status: DC | PRN
  Administered 2014-10-12: 1 via OPHTHALMIC

## 2014-10-12 MED ORDER — MORPHINE SULFATE 2 MG/ML IJ SOLN
2.0000 mg | INTRAMUSCULAR | Status: DC | PRN
  Administered 2014-10-13: 2 mg via INTRAVENOUS
  Filled 2014-10-12 (×2): qty 1

## 2014-10-12 MED ORDER — TRIAMTERENE-HCTZ 37.5-25 MG PO TABS
1.0000 | ORAL_TABLET | Freq: Every day | ORAL | Status: DC
  Administered 2014-10-12: 1 via ORAL
  Filled 2014-10-12 (×2): qty 1

## 2014-10-12 MED ORDER — FERROUS SULFATE 325 (65 FE) MG PO TABS
325.0000 mg | ORAL_TABLET | Freq: Every day | ORAL | Status: DC
  Filled 2014-10-12 (×2): qty 1

## 2014-10-12 MED ORDER — MIDAZOLAM HCL 2 MG/2ML IJ SOLN
INTRAMUSCULAR | Status: AC
  Filled 2014-10-12: qty 2

## 2014-10-12 MED ORDER — ZOLPIDEM TARTRATE 5 MG PO TABS: 5.0000 mg | ORAL_TABLET | Freq: Every evening | ORAL | Status: DC | PRN

## 2014-10-12 MED ORDER — METOCLOPRAMIDE HCL 5 MG/ML IJ SOLN
INTRAMUSCULAR | Status: DC | PRN
  Administered 2014-10-12: 5 mg via INTRAVENOUS

## 2014-10-12 MED ORDER — LACTATED RINGERS IV SOLN
INTRAVENOUS | Status: DC
  Administered 2014-10-12: 10:00:00 via INTRAVENOUS

## 2014-10-12 MED ORDER — HYDROMORPHONE HCL 1 MG/ML IJ SOLN
0.2500 mg | INTRAMUSCULAR | Status: DC | PRN
  Administered 2014-10-12 (×2): 0.5 mg via INTRAVENOUS

## 2014-10-12 MED ORDER — LACTATED RINGERS IV SOLN
INTRAVENOUS | Status: DC | PRN
  Administered 2014-10-12 (×2): via INTRAVENOUS

## 2014-10-12 MED ORDER — EPHEDRINE SULFATE 50 MG/ML IJ SOLN
INTRAMUSCULAR | Status: DC | PRN
  Administered 2014-10-12: 10 mg via INTRAVENOUS

## 2014-10-12 MED ORDER — LIDOCAINE HCL (CARDIAC) 20 MG/ML IV SOLN
INTRAVENOUS | Status: AC
  Filled 2014-10-12: qty 10

## 2014-10-12 MED ORDER — OXYCODONE HCL 5 MG PO TABS: 5.0000 mg | ORAL_TABLET | Freq: Once | ORAL | Status: AC | PRN

## 2014-10-12 MED ORDER — VITAMIN D3 25 MCG (1000 UNIT) PO TABS
2000.0000 [IU] | ORAL_TABLET | Freq: Every day | ORAL | Status: DC
  Administered 2014-10-12: 2000 [IU] via ORAL
  Filled 2014-10-12 (×2): qty 2

## 2014-10-12 MED ORDER — SUCCINYLCHOLINE CHLORIDE 20 MG/ML IJ SOLN
INTRAMUSCULAR | Status: AC
  Filled 2014-10-12: qty 1

## 2014-10-12 MED ORDER — OXYCODONE HCL 5 MG/5ML PO SOLN
ORAL | Status: AC
  Filled 2014-10-12: qty 5

## 2014-10-12 MED ORDER — DEXTROSE-NACL 5-0.45 % IV SOLN
INTRAVENOUS | Status: DC
  Administered 2014-10-12: 17:00:00 via INTRAVENOUS

## 2014-10-12 MED ORDER — AMLODIPINE BESYLATE 5 MG PO TABS
5.0000 mg | ORAL_TABLET | Freq: Every day | ORAL | Status: DC
  Administered 2014-10-13: 5 mg via ORAL
  Filled 2014-10-12 (×2): qty 1

## 2014-10-12 MED ORDER — ONDANSETRON HCL 4 MG/2ML IJ SOLN
INTRAMUSCULAR | Status: DC | PRN
  Administered 2014-10-12: 4 mg via INTRAVENOUS

## 2014-10-12 SURGICAL SUPPLY — 52 items
APPLIER CLIP 9.375 SM OPEN (CLIP) ×3
ATTRACTOMAT 16X20 MAGNETIC DRP (DRAPES) ×3 IMPLANT
BLADE SURG 10 STRL SS (BLADE) ×3 IMPLANT
BLADE SURG 15 STRL LF DISP TIS (BLADE) ×1 IMPLANT
BLADE SURG 15 STRL SS (BLADE) ×2
CANISTER SUCTION 2500CC (MISCELLANEOUS) ×3 IMPLANT
CLEANER TIP ELECTROSURG 2X2 (MISCELLANEOUS) ×3 IMPLANT
CLIP APPLIE 9.375 SM OPEN (CLIP) ×1 IMPLANT
CONT SPECI 4OZ STER CLIK (MISCELLANEOUS) ×6 IMPLANT
CORDS BIPOLAR (ELECTRODE) ×3 IMPLANT
COVER SURGICAL LIGHT HANDLE (MISCELLANEOUS) ×6 IMPLANT
DRAIN SNY 10 ROU (WOUND CARE) ×3 IMPLANT
DRAPE PROXIMA HALF (DRAPES) ×3 IMPLANT
ELECT COATED BLADE 2.86 ST (ELECTRODE) ×6 IMPLANT
ELECT REM PT RETURN 9FT ADLT (ELECTROSURGICAL) ×3
ELECTRODE REM PT RTRN 9FT ADLT (ELECTROSURGICAL) ×1 IMPLANT
EVACUATOR SILICONE 100CC (DRAIN) ×3 IMPLANT
GAUZE SPONGE 4X4 16PLY XRAY LF (GAUZE/BANDAGES/DRESSINGS) ×6 IMPLANT
GLOVE BIOGEL M 7.0 STRL (GLOVE) ×3 IMPLANT
GLOVE BIOGEL PI IND STRL 6.5 (GLOVE) ×1 IMPLANT
GLOVE BIOGEL PI INDICATOR 6.5 (GLOVE) ×2
GLOVE ECLIPSE 8.0 STRL XLNG CF (GLOVE) ×3 IMPLANT
GLOVE SURG SS PI 7.0 STRL IVOR (GLOVE) ×3 IMPLANT
GOWN STRL REUS W/ TWL LRG LVL3 (GOWN DISPOSABLE) ×3 IMPLANT
GOWN STRL REUS W/ TWL XL LVL3 (GOWN DISPOSABLE) ×1 IMPLANT
GOWN STRL REUS W/TWL LRG LVL3 (GOWN DISPOSABLE) ×6
GOWN STRL REUS W/TWL XL LVL3 (GOWN DISPOSABLE) ×2
KIT BASIN OR (CUSTOM PROCEDURE TRAY) ×3 IMPLANT
KIT ROOM TURNOVER OR (KITS) ×3 IMPLANT
LIQUID BAND (GAUZE/BANDAGES/DRESSINGS) ×3 IMPLANT
MARKER SKIN DUAL TIP RULER LAB (MISCELLANEOUS) ×3 IMPLANT
NS IRRIG 1000ML POUR BTL (IV SOLUTION) ×3 IMPLANT
PAD ARMBOARD 7.5X6 YLW CONV (MISCELLANEOUS) ×9 IMPLANT
PENCIL BUTTON HOLSTER BLD 10FT (ELECTRODE) ×3 IMPLANT
PROBE NERVBE PRASS .33 (MISCELLANEOUS) ×3 IMPLANT
SHEARS HARMONIC 9CM CVD (BLADE) ×3 IMPLANT
SPONGE INTESTINAL PEANUT (DISPOSABLE) ×3 IMPLANT
STAPLER VISISTAT 35W (STAPLE) ×3 IMPLANT
SUT CHROMIC 2 0 SH (SUTURE) IMPLANT
SUT ETHILON 3 0 PS 1 (SUTURE) ×3 IMPLANT
SUT ETHILON 5 0 PS 2 18 (SUTURE) ×3 IMPLANT
SUT SILK 2 0 REEL (SUTURE) ×3 IMPLANT
SUT SILK 2 0 SH CR/8 (SUTURE) ×3 IMPLANT
SUT SILK 3 0 REEL (SUTURE) ×3 IMPLANT
SUT SILK 4 0 REEL (SUTURE) IMPLANT
SUT VIC AB 3-0 SH 27 (SUTURE) ×2
SUT VIC AB 3-0 SH 27X BRD (SUTURE) ×1 IMPLANT
SUT VICRYL 4-0 PS2 18IN ABS (SUTURE) ×3 IMPLANT
SYR CONTROL 10ML LL (SYRINGE) ×3 IMPLANT
TOWEL OR 17X24 6PK STRL BLUE (TOWEL DISPOSABLE) ×3 IMPLANT
TOWEL OR 17X26 10 PK STRL BLUE (TOWEL DISPOSABLE) ×3 IMPLANT
TRAY ENT MC OR (CUSTOM PROCEDURE TRAY) ×3 IMPLANT

## 2014-10-12 NOTE — Anesthesia Postprocedure Evaluation (Signed)
  Anesthesia Post-op Note  Patient: Cindy Ellis  Procedure(s) Performed: Procedure(s): RIGHT HEMI-THYROIDECTOMY WITH NIMS MONITOR (Right)  Patient Location: PACU  Anesthesia Type:General  Level of Consciousness: awake, alert  and oriented  Airway and Oxygen Therapy: Patient Spontanous Breathing  Post-op Pain: none  Post-op Assessment: Post-op Vital signs reviewed  Post-op Vital Signs: Reviewed  Last Vitals:  Filed Vitals:   10/12/14 1520  BP: 140/60  Pulse: 70  Temp: 36.4 C  Resp: 16    Complications: No apparent anesthesia complications

## 2014-10-12 NOTE — Transfer of Care (Signed)
Immediate Anesthesia Transfer of Care Note  Patient: Cindy Ellis  Procedure(s) Performed: Procedure(s): RIGHT HEMI-THYROIDECTOMY WITH NIMS MONITOR (Right)  Patient Location: PACU  Anesthesia Type:General  Level of Consciousness: awake, alert  and oriented  Airway & Oxygen Therapy: Patient Spontanous Breathing and Patient connected to face mask oxygen  Post-op Assessment: Report given to PACU RN, Post -op Vital signs reviewed and stable and Patient moving all extremities X 4  Post vital signs: Reviewed and stable  Complications: No apparent anesthesia complications

## 2014-10-12 NOTE — Brief Op Note (Signed)
10/12/2014  1:09 PM  PATIENT:  Kristen Cardinal  58 y.o. female  PRE-OPERATIVE DIAGNOSIS:  neoplasm thyroid gland  POST-OPERATIVE DIAGNOSIS:  neoplasm thyroid gland  PROCEDURE:  Procedure(s): RIGHT HEMI-THYROIDECTOMY WITH NIMS MONITOR (Right)  SURGEON:  Surgeon(s) and Role:    * Jerrell Belfast, MD - Primary    * Jodi Marble, MD - Assisting  PHYSICIAN ASSISTANT:   ASSISTANTS: Dr. Erik Obey   ANESTHESIA:   general  EBL:  Total I/O In: 1600 [I.V.:1600] Out: - 40cc  BLOOD ADMINISTERED:none  DRAINS: (10Fr) Jackson-Pratt drain(s) with closed bulb suction in the ant neck   LOCAL MEDICATIONS USED:  LIDOCAINE  and Amount: 3 ml  SPECIMEN:  Source of Specimen:  Rt thyroid lobe  DISPOSITION OF SPECIMEN:  PATHOLOGY  COUNTS:  YES  TOURNIQUET:  * No tourniquets in log *  DICTATION: .Other Dictation: Dictation Number [id number]  PLAN OF CARE: Admit for overnight observation  PATIENT DISPOSITION:  PACU - hemodynamically stable.   Delay start of Pharmacological VTE agent (>24hrs) due to surgical blood loss or risk of bleeding: no

## 2014-10-12 NOTE — Progress Notes (Signed)
Patient present on floor from PACU. Report received from [place], [place]. Patient stable.

## 2014-10-12 NOTE — Progress Notes (Signed)
   ENT Progress Note: s/p Procedure(s): RIGHT HEMI-THYROIDECTOMY WITH NIMS MONITOR   Subjective: Stable, min pain  Objective: Vital signs in last 24 hours: Temp:  [97.1 F (36.2 C)-98 F (36.7 C)] 97.6 F (36.4 C) (12/09 1520) Pulse Rate:  [58-70] 70 (12/09 1520) Resp:  [12-20] 16 (12/09 1520) BP: (140-161)/(60-80) 140/60 mmHg (12/09 1520) SpO2:  [91 %-99 %] 95 % (12/09 1520) Weight:  [131.997 kg (291 lb)] 131.997 kg (291 lb) (12/09 0943) Weight change:     Intake/Output from previous day:   Intake/Output this shift: Total I/O In: 1600 [I.V.:1600] Out: 475 [Urine:475]  Labs:  Recent Labs  10/12/14 0938  WBC 6.5  HGB 13.7  HCT 42.6  PLT 234    Recent Labs  10/12/14 0938  NA 141  K 3.7  CL 104  CO2 23  GLUCOSE 92  BUN 16  CALCIUM 9.8    Studies/Results: No results found.   PHYSICAL EXAM: Inc intact, JP functional Airway stable   Assessment/Plan: Stable postop Plan O/N obs    Cindy Ellis 10/12/2014, 5:12 PM

## 2014-10-12 NOTE — Anesthesia Procedure Notes (Signed)
Procedure Name: Intubation Date/Time: 10/12/2014 11:15 AM Performed by: Neldon Newport Pre-anesthesia Checklist: Patient identified, Emergency Drugs available, Suction available, Patient being monitored and Timeout performed Patient Re-evaluated:Patient Re-evaluated prior to inductionOxygen Delivery Method: Circle system utilized Preoxygenation: Pre-oxygenation with 100% oxygen Intubation Type: IV induction and Rapid sequence Ventilation: Mask ventilation without difficulty Laryngoscope Size: Mac and 4 Grade View: Grade I Tube type: Oral (NIM nerve monitoring tube) Placement Confirmation: CO2 detector,  positive ETCO2 and ETT inserted through vocal cords under direct vision Secured at: 22 cm Tube secured with: Tape Dental Injury: Teeth and Oropharynx as per pre-operative assessment

## 2014-10-12 NOTE — H&P (Signed)
Cindy Ellis is an 58 y.o. female.   Chief Complaint: Right thyroid mass HPI: Pt with hx of right thyroid mass, Bx ? neoplasm  Past Medical History  Diagnosis Date  . Hypertension     takes Lisinopril,Atenolol,Amlodipine,and Triam-HCTZ daily  . Hyperlipidemia     takes Atorvastatin daily  . Headache(784.0)     occasionally  . Pituitary tumor   . History of colon polyps   . Anemia     hx of;when she was younger  . History of blood transfusion     Past Surgical History  Procedure Laterality Date  . Tubal ligation  1980  . Abdominal hysterectomy  1999  . Carpal tunnel repair Bilateral 2014  . Cataract surgery Bilateral   . Colonoscopy    . Craniotomy N/A 04/25/2014    Procedure: CRANIOTOMY HYPOPHYSECTOMY TRANSNASAL APPROACH;  Surgeon: Hosie Spangle, MD;  Location: [facility];  Service: Neurosurgery;  Laterality: N/A;  Transphenoidal resection of pituitary tumor Dr Wilburn Cornelia to approach  . Transnasal approach N/A 04/25/2014    Procedure: TRANSNASAL APPROACH;  Surgeon: Jerrell Belfast, MD;  Location: [facility];  Service: ENT;  Laterality: N/A;  TRANSNASAL APPROACH    No family history on file. Social History:  reports that she has never smoked. She does not have any smokeless tobacco history on file. She reports that she does not drink alcohol or use illicit drugs.  Allergies: No Known Allergies  No prescriptions prior to admission    No results found for this or any previous visit (from the past 48 hour(s)). No results found.  Review of Systems  Constitutional: Negative.   Respiratory: Negative.   Cardiovascular: Negative.   Gastrointestinal: Negative.     There were no vitals taken for this visit. Physical Exam  Constitutional: She is oriented to person, place, and time. She appears well-developed and well-nourished.  Neck: Normal range of motion. Neck supple. Thyromegaly present.  Cardiovascular: Normal rate.   Respiratory: Effort normal.  GI: Soft.   Musculoskeletal: Normal range of motion.  Neurological: She is alert and oriented to person, place, and time.     Assessment/Plan Adm for Right thyroidectomy with O/N obs.  [place], Cindy Ellis 10/12/2014, 8:18 AM

## 2014-10-12 NOTE — Anesthesia Preprocedure Evaluation (Addendum)
Anesthesia Evaluation  Patient identified by MRN, date of birth, ID band Patient awake    Reviewed: Allergy & Precautions, H&P , NPO status , Patient's Chart, lab work & pertinent test results  Airway Mallampati: III  TM Distance: >3 FB Neck ROM: Full    Dental  (+) Teeth Intact, Dental Advidsory Given   Pulmonary neg pulmonary ROS,          Cardiovascular hypertension, Pt. on medications and Pt. on home beta blockers Rhythm:regular     Neuro/Psych  Headaches, negative psych ROS   GI/Hepatic negative GI ROS, Neg liver ROS,   Endo/Other  Morbid obesity  Renal/GU negative Renal ROS     Musculoskeletal negative musculoskeletal ROS (+)   Abdominal   Peds  Hematology  (+) anemia ,   Anesthesia Other Findings   Reproductive/Obstetrics                           Anesthesia Physical Anesthesia Plan  ASA: III  Anesthesia Plan: General   Post-op Pain Management:    Induction: Intravenous  Airway Management Planned: Oral ETT  Additional Equipment:   Intra-op Plan:   Post-operative Plan: Extubation in OR  Informed Consent: I have reviewed the patients History and Physical, chart, labs and discussed the procedure including the risks, benefits and alternatives for the proposed anesthesia with the patient or authorized representative who has indicated his/her understanding and acceptance.   Dental advisory given and Dental Advisory Given  Plan Discussed with: CRNA, Surgeon and Anesthesiologist  Anesthesia Plan Comments:       Anesthesia Quick Evaluation

## 2014-10-13 DIAGNOSIS — C73 Malignant neoplasm of thyroid gland: Secondary | ICD-10-CM | POA: Diagnosis not present

## 2014-10-13 NOTE — Op Note (Signed)
NAMETIMYA, TRIMMER NO.:  [id number]  MEDICAL RECORD NO.:  [id number]  LOCATION:  6N01C                        FACILITY:  [facility]  PHYSICIAN:  Early Chars. Wilburn Cornelia, M.D.DATE OF BIRTH:  31-Oct-1956  DATE OF PROCEDURE:  10/12/2014 DATE OF DISCHARGE:                              OPERATIVE REPORT   PREOPERATIVE DIAGNOSIS:  Right thyroid mass.  POSTOPERATIVE DIAGNOSIS:  Right thyroid mass.  INDICATIONS FOR SURGERY:  Right thyroid mass.  SURGICAL PROCEDURE:  Right hemithyroidectomy with intraoperative nerve monitor.  ANESTHESIA:  General endotracheal.  SURGEON:  Early Chars. Wilburn Cornelia, M.D.  ASSISTANT:  Marikay Alar. Erik Obey, M.D.  The patient transferred from the operating room to the recovery room in stable condition.  There were no complications.  ESTIMATED BLOOD LOSS:  Less than 50 mL.  BRIEF HISTORY:  The patient is a 58 year old, black female, who had undergone previous pituitary resection.  She had a known right thyroid nodule and on endocrine evaluation in her postoperative period after her pituitary surgery, was found to have an enlarging mass involving the right thyroid lobe.  Workup included ultrasound and CT scan of the neck which showed a 4 cm heterogeneous mass involving the right lobe of the thyroid gland with extension superiorly.  There was no other adenopathy or abnormal findings on the left-hand side.  The patient underwent ultrasound-guided needle biopsy of the mass and pathology was indeterminate.  Given her history and findings, we recommended right thyroidectomy for removal of the mass and definitive pathologic diagnosis.  The risks and benefits of the procedure were discussed in detail with the patient and her family and they understood and concurred with our plan for surgery which is scheduled on elective basis at [facility].  DESCRIPTION OF PROCEDURE:  The patient was brought to the operating room on October 07, 2014, and  placed in supine position on the operating table, general endotracheal anesthesia was established without difficulty.  The endotracheal tube was a [employer] (nerve integrity monitor system) endotracheal tube.  When the patient's airway was secured and the tube was placed, this allowed for intraoperative monitoring of recurrent laryngeal nerves.  The tube was placed and the patient was positioned and draped and there was good feedback on the NIMS device.  The patient was then injected with 3 mL of 1% lidocaine 1:100,000 solution epinephrine injected in a subcutaneous fashion and preexisting skin crease.  She was positioned prepped and draped and the surgical procedure was begun.  A 4 cm horizontally oriented curved anterior neck incision was created in a preexisting skin crease.  This was carried through the skin underlying the subcutaneous tissue.  Anterior jugular veins were identified on the right-hand side, divided, ligated and the platysma muscle was identified laterally. Subplatysmal planes were elevated allowing access to the anterior compartment of the neck.  The strap muscles were identified in the midline and divided.  Strap muscles were lateralized.  The patient's left thyroid lobe was then carefully palpated.  There was no nodule or mass.  Attention was turned to the right-hand side, where there was a large nodular-appearing mass in the superior aspect of the right thyroid lobe.  Careful  dissection was then carried out lateralizing the anterior border of the sternocleidomastoid muscle and elevating the right thyroid lobe anteriorly.  The middle thyroid vein was divided and suture ligated.  Careful dissection on the thyroid capsule was then carried out superiorly and inferiorly.  The superior thyroid pedicle was identified.  The blood vessels were then ligated using the Harmonic Scalpel and the superior lobe was elevated inferiorly and medially.  Attention then turned to the  inferior component of the gland.  The large nodular component was palpated.  The gland was then reflected medially and by dissecting along the capsule, the inferior thyroid vessels were divided and ligated using the Harmonic Scalpel.  The recurrent laryngeal nerve was identified inferiorly in the tracheoesophageal groove and inferior soft tissues below the level of the thyroid.  This was then carefully dissected superiorly using the nerve monitor to ensure gentle dissection.  There was a firm nodular component of the thyroid which was immediately adjacent to the recurrent laryngeal nerve  requiring careful dissection.  This was sent as a separate pathologic specimen for frozen section.  It came back as benign- appearing thyroid tissue.  The thyroid isthmus was then divided with the Harmonic Scalpel and the entire right thyroid lobe was dissected free and sent to Pathology for gross microscopic evaluation.  The patient's wound was then thoroughly irrigated with sterile saline.  There was no active bleeding.  Prior to reapproximating the anterior strap muscles, a 10-French round drain was placed at the depth of the incision and sutured to the skin with a 3-0 Ethilon suture.  Strap muscles were reapproximated in the midline using interrupted 3-0 Vicryl sutures. Platysma closure followed by deep subcutaneous closure achieved with interrupted 3-0 Vicryl.  Final subcutaneous closure with 4-0 Vicryl suture and skin edge was closed with Dermabond surgical glue.  The patient was then awakened from anesthetic.  She was extubated and transferred from the operating room to the recovery room in stable condition.  There were no complications.  Estimated blood loss was less than 50 mL.          ______________________________ Early Chars. Wilburn Cornelia, M.D.     DLS/MEDQ  D:  12/15/1550  T:  10/13/2014  Job:  [id number]

## 2014-10-13 NOTE — Discharge Summary (Signed)
Physician Discharge Summary  Patient ID: Cindy Ellis MRN: [id number] DOB/AGE: 1955-11-05 58 y.o.  Admit date: 10/12/2014 Discharge date: 10/13/2014  Admission Diagnoses:  Principal Problem:   Thyroid mass   Discharge Diagnoses:  Same  Surgeries: Procedure(s): RIGHT HEMI-THYROIDECTOMY WITH NIMS MONITOR on 10/12/2014   Consultants: None  Discharged Condition: Improved  Hospital Course: Cindy Ellis is an 58 y.o. female who was admitted 10/12/2014 with a diagnosis of Thyroid mass and went to the operating room on 10/12/2014 and underwent the above named procedures.   Stable postop, d/c to home.  Recent vital signs:  Filed Vitals:   10/13/14 0642  BP: 161/102  Pulse: 66  Temp: 97.4 F (36.3 C)  Resp: 16    Recent laboratory studies:  Results for orders placed or performed during the hospital encounter of 10/12/14  CBC  Result Value Ref Range   WBC 6.5 4.0 - 10.5 K/uL   RBC 4.78 3.87 - 5.11 MIL/uL   Hemoglobin 13.7 12.0 - 15.0 g/dL   HCT 42.6 36.0 - 46.0 %   MCV 89.1 78.0 - 100.0 fL   MCH 28.7 26.0 - 34.0 pg   MCHC 32.2 30.0 - 36.0 g/dL   RDW 14.1 11.5 - 15.5 %   Platelets 234 150 - 400 K/uL  Basic metabolic panel  Result Value Ref Range   Sodium 141 137 - 147 mEq/L   Potassium 3.7 3.7 - 5.3 mEq/L   Chloride 104 96 - 112 mEq/L   CO2 23 19 - 32 mEq/L   Glucose, Bld 92 70 - 99 mg/dL   BUN 16 6 - 23 mg/dL   Creatinine, Ser 0.76 0.50 - 1.10 mg/dL   Calcium 9.8 8.4 - 10.5 mg/dL   GFR calc non Af Amer 90 (L) >90 mL/min   GFR calc Af Amer >90 >90 mL/min   Anion gap 14 5 - 15    Discharge Medications:     Medication List    TAKE these medications        amLODipine 5 MG tablet  Commonly known as:  NORVASC  Take 5 mg by mouth daily.     atenolol 25 MG tablet  Commonly known as:  TENORMIN  Take 25 mg by mouth daily.     ferrous sulfate 325 (65 FE) MG tablet  Take 325 mg by mouth daily with breakfast.     FISH OIL EXTRA STRENGTH 435 MG Caps  Take 1  capsule by mouth daily.     Garlic 10 MG Caps  Take 10 mg by mouth daily.     HYDROcodone-acetaminophen 5-325 MG per tablet  Commonly known as:  NORCO  Take 1-2 tablets by mouth every 6 (six) hours as needed.     levothyroxine 75 MCG tablet  Commonly known as:  SYNTHROID, LEVOTHROID  Take 75 mcg by mouth daily before breakfast.     LIPITOR 40 MG tablet  Generic drug:  atorvastatin  Take 40 mg by mouth daily.     lisinopril 40 MG tablet  Commonly known as:  PRINIVIL,ZESTRIL  Take 40 mg by mouth daily.     MAXZIDE-25 37.5-25 MG per tablet  Generic drug:  triamterene-hydrochlorothiazide  Take 1 tablet by mouth daily.     SM MULTIPLE VITAMINS/IRON Tabs  Take 1 tablet by mouth daily.     vitamin B-12 500 MCG tablet  Commonly known as:  CYANOCOBALAMIN  Take 500 mcg by mouth daily.     Vitamin D3 2000 UNITS Tabs  Take  1 tablet by mouth daily.        Diagnostic Studies: Ct Soft Tissue Neck W Contrast  09/16/2014   CLINICAL DATA:  Evaluate thyroid mass.  EXAM: CT NECK WITH CONTRAST  TECHNIQUE: Multidetector CT imaging of the neck was performed using the standard protocol following the bolus administration of intravenous contrast.  CONTRAST:  155mL OMNIPAQUE IOHEXOL 300 MG/ML  SOLN  COMPARISON:  Ultrasound of the thyroid 05/05/2014. Thyroid biopsy performed 05/11/2014.  FINDINGS: Suprahyoid neck:  Normal.  Larynx:  Normal.  Infrahyoid neck: As identified from sonography, there is a large RIGHT-sided thyroid nodule measuring 41 x 34 x 33 mm. This appears slightly increased in size from sonography which measured 37 x 29 x 36 mm when technique differences are considered. Essentially normal LEFT lobe with difficult to visualize subcentimeter nodule better demonstrated on ultrasound.  Lymph nodes:  Nonvisualized  Upper chest/mediastinum:  No substernal extension.  No lung masses.  Additional: Mild atheromatous change at the carotid bifurcations and transverse arch. No osseous lesions.   IMPRESSION: Large RIGHT-sided thyroid nodule measuring 41 x 34 x 33 mm. This appears slightly increased in size from prior neck sonography. Tissue sampling was inconclusive. A follicular neoplasm is not excluded, although there is no evidence for local invasion or metastatic adenopathy.   Electronically Signed   By: Rolla Flatten M.D.   On: 09/16/2014 11:40    Disposition: 01-Home or Self Care      Discharge Instructions    Diet - low sodium heart healthy    Complete by:  As directed      Diet - low sodium heart healthy    Complete by:  As directed      Discharge instructions    Complete by:  As directed   1. Limited activity, no lifting or straining 2. Liquid and soft diet, advance as tolerated 3. May bathe and shower day after surgery 4. Wound care - Gentle cleaning with soap and water 5. DO NOT APPLY ANY OINTMENT 6. Elevate Head of Bed     Increase activity slowly    Complete by:  As directed      Increase activity slowly    Complete by:  As directed            Follow-up Information    Follow up with Marvena Tally, MD In 10 days.   Specialty:  Otolaryngology   Contact information:   [address] [place] [address] [phone]        Signed: Jerrell Belfast 10/13/2014, 7:11 AM

## 2014-10-13 NOTE — Progress Notes (Signed)
Discussed discharge summary with patient. Reviewed medications with patient. JP drain removed by MD. Patient has no questions and ready for discharge.

## 2014-10-13 NOTE — Progress Notes (Signed)
   ENT Progress Note: POD #1 s/p Procedure(s): RIGHT HEMI-THYROIDECTOMY WITH NIMS MONITOR   Subjective: No pain, tol po  Objective: Vital signs in last 24 hours: Temp:  [97.1 F (36.2 C)-98.2 F (36.8 C)] 97.4 F (36.3 C) (12/10 0300) Pulse Rate:  [58-70] 66 (12/10 0642) Resp:  [12-20] 16 (12/10 0642) BP: (134-161)/(60-102) 161/102 mmHg (12/10 0642) SpO2:  [91 %-99 %] 96 % (12/10 0642) Weight:  [131.997 kg (291 lb)] 131.997 kg (291 lb) (12/09 0943) Weight change:     Intake/Output from previous day: 12/09 0701 - 12/10 0700 In: 3280 [P.O.:1680; I.V.:1600] Out: 2258 [Urine:2225; Drains:33] Intake/Output this shift:    Labs:  Recent Labs  10/12/14 0938  WBC 6.5  HGB 13.7  HCT 42.6  PLT 234    Recent Labs  10/12/14 0938  NA 141  K 3.7  CL 104  CO2 23  GLUCOSE 92  BUN 16  CALCIUM 9.8    Studies/Results: No results found.   PHYSICAL EXAM: Inc intact JP removed No swelling Airway stable   Assessment/Plan: Stable D/C to home    Cindy Ellis 10/13/2014, 7:09 AM

## 2014-10-14 ENCOUNTER — Encounter (HOSPITAL_COMMUNITY): Payer: Self-pay | Admitting: Otolaryngology

## 2014-12-16 ENCOUNTER — Other Ambulatory Visit: Payer: Self-pay | Admitting: Otolaryngology

## 2014-12-16 ENCOUNTER — Ambulatory Visit (HOSPITAL_COMMUNITY)
Admission: RE | Admit: 2014-12-16 | Discharge: 2014-12-16 | Disposition: A | Discharge status: Home / Self Care | Payer: BC Managed Care – PPO | Source: Ambulatory Visit | Attending: Anesthesiology | Admitting: Anesthesiology

## 2014-12-16 ENCOUNTER — Encounter (HOSPITAL_COMMUNITY): Payer: Self-pay

## 2014-12-16 ENCOUNTER — Encounter (HOSPITAL_COMMUNITY)
Admission: RE | Admit: 2014-12-16 | Discharge: 2014-12-16 | Disposition: A | Discharge status: Home / Self Care | Payer: BC Managed Care – PPO | Source: Ambulatory Visit | Attending: Otolaryngology | Admitting: Otolaryngology

## 2014-12-16 DIAGNOSIS — Z01812 Encounter for preprocedural laboratory examination: Secondary | ICD-10-CM | POA: Diagnosis not present

## 2014-12-16 DIAGNOSIS — E041 Nontoxic single thyroid nodule: Secondary | ICD-10-CM | POA: Diagnosis not present

## 2014-12-16 DIAGNOSIS — Z01811 Encounter for preprocedural respiratory examination: Secondary | ICD-10-CM | POA: Diagnosis present

## 2014-12-16 DIAGNOSIS — Z9009 Acquired absence of other part of head and neck: Secondary | ICD-10-CM

## 2014-12-16 DIAGNOSIS — E890 Postprocedural hypothyroidism: Secondary | ICD-10-CM

## 2014-12-16 HISTORY — DX: Presence of spectacles and contact lenses: Z97.3

## 2014-12-16 HISTORY — DX: Malignant (primary) neoplasm, unspecified: C80.1

## 2014-12-16 LAB — BASIC METABOLIC PANEL
Anion gap: 9 (ref 5–15)
BUN: 12 mg/dL (ref 6–23)
CHLORIDE: 109 mmol/L (ref 96–112)
CO2: 25 mmol/L (ref 19–32)
CREATININE: 0.84 mg/dL (ref 0.50–1.10)
Calcium: 10.3 mg/dL (ref 8.4–10.5)
GFR calc non Af Amer: 74 mL/min — ABNORMAL LOW (ref >90)
GFR, EST AFRICAN AMERICAN: 86 mL/min — AB (ref >90)
GLUCOSE: 106 mg/dL — AB (ref 70–99)
Potassium: 3.9 mmol/L (ref 3.5–5.1)
Sodium: 143 mmol/L (ref 135–145)

## 2014-12-16 LAB — CBC
HEMATOCRIT: 43.1 % (ref 36.0–46.0)
Hemoglobin: 13.5 g/dL (ref 12.0–15.0)
MCH: 28.1 pg (ref 26.0–34.0)
MCHC: 31.3 g/dL (ref 30.0–36.0)
MCV: 89.6 fL (ref 78.0–100.0)
Platelets: 211 10*3/uL (ref 150–400)
RBC: 4.81 MIL/uL (ref 3.87–5.11)
RDW: 14 % (ref 11.5–15.5)
WBC: 6.2 10*3/uL (ref 4.0–10.5)

## 2014-12-16 NOTE — Pre-Procedure Instructions (Signed)
Cindy Ellis  12/16/2014   Your procedure is scheduled on: Friday, December 23, 2014  Report to [facility] Admitting at 7:30 AM.  Call this number if you have problems the morning of surgery: [phone]   Remember:   Do not eat food or drink liquids after midnight Thursday, December 22, 2014   Take these medicines the morning of surgery with A SIP OF WATER: amLODipine (NORVASC),  atenolol (TENORMIN), levothyroxine (SYNTHROID, LEVOTHROID),  Stop taking Aspirin, vitamins and herbal medications such as Garlic and Omega-3 Fatty Acids (FISH OIL) . Do not take any NSAIDs ie: Ibuprofen, Advil, Naproxen or any medication containing Aspirin; stop now.  Do not wear jewelry, make-up or nail polish.  Do not wear lotions, powders, or perfumes. You may not wear deodorant.  Do not shave 48 hours prior to surgery.   Do not bring valuables to the hospital.  [facility] is not responsible for any belongings or valuables.               Contacts, dentures or bridgework may not be worn into surgery.  Leave suitcase in the car. After surgery it may be brought to your room.  For patients admitted to the hospital, discharge time is determined by your treatment team.               Patients discharged the day of surgery will not be allowed to drive home.  Name and phone number of your driver:   Special Instructions:  Special Instructions:Special Instructions: [facility] - Preparing for Surgery  Before surgery, you can play an important role.  Because skin is not sterile, your skin needs to be as free of germs as possible.  You can reduce the number of germs on you skin by washing with CHG (chlorahexidine gluconate) soap before surgery.  CHG is an antiseptic cleaner which kills germs and bonds with the skin to continue killing germs even after washing.  Please DO NOT use if you have an allergy to CHG or antibacterial soaps.  If your skin becomes reddened/irritated stop using the CHG and inform your  nurse when you arrive at Short Stay.  Do not shave (including legs and underarms) for at least 48 hours prior to the first CHG shower.  You may shave your face.  Please follow these instructions carefully:   1.  Shower with CHG Soap the night before surgery and the morning of Surgery.  2.  If you choose to wash your hair, wash your hair first as usual with your normal shampoo.  3.  After you shampoo, rinse your hair and body thoroughly to remove the Shampoo.  4.  Use CHG as you would any other liquid soap.  You can apply chg directly  to the skin and wash gently with scrungie or a clean washcloth.  5.  Apply the CHG Soap to your body ONLY FROM THE NECK DOWN.  Do not use on open wounds or open sores.  Avoid contact with your eyes, ears, mouth and genitals (private parts).  Wash genitals (private parts) with your normal soap.  6.  Wash thoroughly, paying special attention to the area where your surgery will be performed.  7.  Thoroughly rinse your body with warm water from the neck down.  8.  DO NOT shower/wash with your normal soap after using and rinsing off the CHG Soap.  9.  Pat yourself dry with a clean towel.  10.  Wear clean pajamas.            11.  Place clean sheets on your bed the night of your first shower and do not sleep with pets.  Day of Surgery  Do not apply any lotions/deodorants the morning of surgery.  Please wear clean clothes to the hospital/surgery center.   Please read over the following fact sheets that you were given: Pain Booklet, Coughing and Deep Breathing and Surgical Site Infection Prevention

## 2014-12-16 NOTE — Progress Notes (Signed)
   12/16/14 1417  OBSTRUCTIVE SLEEP APNEA  Have you ever been diagnosed with sleep apnea through a sleep study? No  Do you snore loudly (loud enough to be heard through closed doors)?  0  Do you often feel tired, fatigued, or sleepy during the daytime? 0  Has anyone observed you stop breathing during your sleep? 0  Do you have, or are you being treated for high blood pressure? 1  BMI more than 35 kg/m2? 1  Age over 61 years old? 1  Neck circumference greater than 40 cm/16 inches? 1  Gender: 0  Obstructive Sleep Apnea Score 4

## 2014-12-22 MED ORDER — DEXAMETHASONE SODIUM PHOSPHATE 10 MG/ML IJ SOLN
10.0000 mg | Freq: Once | INTRAMUSCULAR | Status: AC
  Administered 2014-12-23: 10 mg via INTRAVENOUS
  Filled 2014-12-22: qty 1

## 2014-12-22 MED ORDER — CEFAZOLIN SODIUM 10 G IJ SOLR
3.0000 g | INTRAMUSCULAR | Status: AC
  Administered 2014-12-23: 3 g via INTRAVENOUS
  Filled 2014-12-22: qty 3000

## 2014-12-23 ENCOUNTER — Encounter (HOSPITAL_COMMUNITY)
Admission: RE | Disposition: A | Discharge status: Home / Self Care | Payer: Self-pay | Source: Ambulatory Visit | Attending: Otolaryngology

## 2014-12-23 ENCOUNTER — Encounter (HOSPITAL_COMMUNITY): Payer: Self-pay | Admitting: *Deleted

## 2014-12-23 ENCOUNTER — Ambulatory Visit (HOSPITAL_COMMUNITY): Payer: BC Managed Care – PPO | Admitting: Anesthesiology

## 2014-12-23 ENCOUNTER — Ambulatory Visit (HOSPITAL_COMMUNITY)
Admission: RE | Admit: 2014-12-23 | Discharge: 2014-12-24 | Disposition: A | Discharge status: Home / Self Care | Payer: BC Managed Care – PPO | Source: Ambulatory Visit | Attending: Otolaryngology | Admitting: Otolaryngology

## 2014-12-23 DIAGNOSIS — D509 Iron deficiency anemia, unspecified: Secondary | ICD-10-CM | POA: Diagnosis not present

## 2014-12-23 DIAGNOSIS — Z79899 Other long term (current) drug therapy: Secondary | ICD-10-CM | POA: Insufficient documentation

## 2014-12-23 DIAGNOSIS — I10 Essential (primary) hypertension: Secondary | ICD-10-CM | POA: Diagnosis not present

## 2014-12-23 DIAGNOSIS — E785 Hyperlipidemia, unspecified: Secondary | ICD-10-CM | POA: Diagnosis not present

## 2014-12-23 DIAGNOSIS — C73 Malignant neoplasm of thyroid gland: Secondary | ICD-10-CM | POA: Insufficient documentation

## 2014-12-23 HISTORY — PX: THYROIDECTOMY, PARTIAL: SHX18

## 2014-12-23 HISTORY — PX: THYROIDECTOMY: SHX17

## 2014-12-23 LAB — CBC
HCT: 43.6 % (ref 36.0–46.0)
Hemoglobin: 14.1 g/dL (ref 12.0–15.0)
MCH: 28.8 pg (ref 26.0–34.0)
MCHC: 32.3 g/dL (ref 30.0–36.0)
MCV: 89.2 fL (ref 78.0–100.0)
Platelets: 240 10*3/uL (ref 150–400)
RBC: 4.89 MIL/uL (ref 3.87–5.11)
RDW: 13.9 % (ref 11.5–15.5)
WBC: 6.7 10*3/uL (ref 4.0–10.5)

## 2014-12-23 LAB — CREATININE, SERUM
Creatinine, Ser: 0.95 mg/dL (ref 0.50–1.10)
GFR calc Af Amer: 74 mL/min — ABNORMAL LOW (ref >90)
GFR calc non Af Amer: 64 mL/min — ABNORMAL LOW (ref >90)

## 2014-12-23 LAB — CALCIUM: Calcium: 10 mg/dL (ref 8.4–10.5)

## 2014-12-23 SURGERY — THYROIDECTOMY
Anesthesia: General | Site: Neck | Laterality: Left

## 2014-12-23 MED ORDER — LACTATED RINGERS IV SOLN
INTRAVENOUS | Status: DC | PRN
  Administered 2014-12-23 (×2): via INTRAVENOUS

## 2014-12-23 MED ORDER — 0.9 % SODIUM CHLORIDE (POUR BTL) OPTIME
TOPICAL | Status: DC | PRN
  Administered 2014-12-23: 1000 mL

## 2014-12-23 MED ORDER — FENTANYL CITRATE 0.05 MG/ML IJ SOLN
INTRAMUSCULAR | Status: AC
  Filled 2014-12-23: qty 5

## 2014-12-23 MED ORDER — AMLODIPINE BESYLATE 5 MG PO TABS
5.0000 mg | ORAL_TABLET | Freq: Every day | ORAL | Status: DC
  Administered 2014-12-24: 5 mg via ORAL
  Filled 2014-12-23: qty 1

## 2014-12-23 MED ORDER — LISINOPRIL 40 MG PO TABS
40.0000 mg | ORAL_TABLET | Freq: Every day | ORAL | Status: DC
  Administered 2014-12-23 – 2014-12-24 (×2): 40 mg via ORAL
  Filled 2014-12-23 (×2): qty 1

## 2014-12-23 MED ORDER — TRIAMTERENE-HCTZ 37.5-25 MG PO TABS
1.0000 | ORAL_TABLET | Freq: Every day | ORAL | Status: DC
  Administered 2014-12-23 – 2014-12-24 (×2): 1 via ORAL
  Filled 2014-12-23 (×2): qty 1

## 2014-12-23 MED ORDER — HYDROCODONE-ACETAMINOPHEN 5-325 MG PO TABS
1.0000 | ORAL_TABLET | Freq: Four times a day (QID) | ORAL | Status: DC | PRN
  Administered 2014-12-23: 2 via ORAL
  Administered 2014-12-23: 1 via ORAL
  Administered 2014-12-24 (×2): 2 via ORAL
  Filled 2014-12-23 (×4): qty 2

## 2014-12-23 MED ORDER — LIDOCAINE HCL (CARDIAC) 20 MG/ML IV SOLN
INTRAVENOUS | Status: DC | PRN
Start: 2014-12-23 — End: 2014-12-23
  Administered 2014-12-23: 70 mg via INTRAVENOUS

## 2014-12-23 MED ORDER — DEXTROSE 5 % IV SOLN
INTRAVENOUS | Status: DC | PRN
  Administered 2014-12-23: 10:00:00 via INTRAVENOUS

## 2014-12-23 MED ORDER — FENTANYL CITRATE 0.05 MG/ML IJ SOLN
INTRAMUSCULAR | Status: DC | PRN
  Administered 2014-12-23: 50 ug via INTRAVENOUS
  Administered 2014-12-23: 25 ug via INTRAVENOUS
  Administered 2014-12-23: 50 ug via INTRAVENOUS
  Administered 2014-12-23: 25 ug via INTRAVENOUS
  Administered 2014-12-23: 50 ug via INTRAVENOUS

## 2014-12-23 MED ORDER — LIDOCAINE-EPINEPHRINE 1 %-1:100000 IJ SOLN
INTRAMUSCULAR | Status: DC | PRN
  Administered 2014-12-23: 3 mL

## 2014-12-23 MED ORDER — DEXTROSE IN LACTATED RINGERS 5 % IV SOLN
INTRAVENOUS | Status: DC
  Administered 2014-12-23 – 2014-12-24 (×2): via INTRAVENOUS

## 2014-12-23 MED ORDER — MIDAZOLAM HCL 5 MG/5ML IJ SOLN: INTRAMUSCULAR | Status: DC | PRN

## 2014-12-23 MED ORDER — MIDAZOLAM HCL 5 MG/5ML IJ SOLN
INTRAMUSCULAR | Status: DC | PRN
  Administered 2014-12-23 (×4): 0.5 mg via INTRAVENOUS

## 2014-12-23 MED ORDER — PROPOFOL 10 MG/ML IV BOLUS
INTRAVENOUS | Status: DC | PRN
  Administered 2014-12-23: 130 mg via INTRAVENOUS

## 2014-12-23 MED ORDER — BACITRACIN ZINC 500 UNIT/GM EX OINT
TOPICAL_OINTMENT | CUTANEOUS | Status: AC
  Filled 2014-12-23: qty 28.35

## 2014-12-23 MED ORDER — HEPARIN SODIUM (PORCINE) 5000 UNIT/ML IJ SOLN
5000.0000 [IU] | Freq: Three times a day (TID) | INTRAMUSCULAR | Status: DC
  Administered 2014-12-24: 5000 [IU] via SUBCUTANEOUS
  Filled 2014-12-23: qty 1

## 2014-12-23 MED ORDER — ZOLPIDEM TARTRATE 5 MG PO TABS: 5.0000 mg | ORAL_TABLET | Freq: Every evening | ORAL | Status: DC | PRN

## 2014-12-23 MED ORDER — ONDANSETRON HCL 4 MG/2ML IJ SOLN
INTRAMUSCULAR | Status: DC | PRN
  Administered 2014-12-23: 4 mg via INTRAVENOUS

## 2014-12-23 MED ORDER — LIDOCAINE-EPINEPHRINE 1 %-1:100000 IJ SOLN
INTRAMUSCULAR | Status: AC
  Filled 2014-12-23: qty 1

## 2014-12-23 MED ORDER — ACETAMINOPHEN 325 MG PO TABS
325.0000 mg | ORAL_TABLET | ORAL | Status: DC | PRN
Start: 2014-12-23 — End: 2014-12-23

## 2014-12-23 MED ORDER — MORPHINE SULFATE 2 MG/ML IJ SOLN: 2.0000 mg | INTRAMUSCULAR | Status: DC | PRN

## 2014-12-23 MED ORDER — CALCIUM CARBONATE-VITAMIN D 500-200 MG-UNIT PO TABS
1.0000 | ORAL_TABLET | Freq: Three times a day (TID) | ORAL | Status: DC
  Administered 2014-12-23 – 2014-12-24 (×2): 1 via ORAL
  Filled 2014-12-23 (×2): qty 1

## 2014-12-23 MED ORDER — ACETAMINOPHEN 160 MG/5ML PO SOLN: 650.0000 mg | ORAL | Status: DC | PRN

## 2014-12-23 MED ORDER — LACTATED RINGERS IV SOLN
INTRAVENOUS | Status: DC
  Administered 2014-12-23: 08:00:00 via INTRAVENOUS

## 2014-12-23 MED ORDER — OXYCODONE HCL 5 MG/5ML PO SOLN
ORAL | Status: AC
  Filled 2014-12-23: qty 5

## 2014-12-23 MED ORDER — CALCIUM-VITAMIN D 250-125 MG-UNIT PO TABS: 1.0000 | ORAL_TABLET | Freq: Three times a day (TID) | ORAL | Status: DC

## 2014-12-23 MED ORDER — ONDANSETRON HCL 4 MG/2ML IJ SOLN: 4.0000 mg | INTRAMUSCULAR | Status: DC | PRN

## 2014-12-23 MED ORDER — HYDROMORPHONE HCL 1 MG/ML IJ SOLN
0.2500 mg | INTRAMUSCULAR | Status: DC | PRN
  Administered 2014-12-23 (×2): 0.5 mg via INTRAVENOUS

## 2014-12-23 MED ORDER — OXYCODONE HCL 5 MG PO TABS: 5.0000 mg | ORAL_TABLET | Freq: Once | ORAL | Status: AC | PRN

## 2014-12-23 MED ORDER — ACETAMINOPHEN 650 MG RE SUPP: 650.0000 mg | RECTAL | Status: DC | PRN

## 2014-12-23 MED ORDER — HYDROMORPHONE HCL 1 MG/ML IJ SOLN
INTRAMUSCULAR | Status: AC
Start: 2014-12-23 — End: 2014-12-24
  Filled 2014-12-23: qty 1

## 2014-12-23 MED ORDER — MIDAZOLAM HCL 2 MG/2ML IJ SOLN
INTRAMUSCULAR | Status: AC
  Filled 2014-12-23: qty 2

## 2014-12-23 MED ORDER — ATORVASTATIN CALCIUM 40 MG PO TABS
40.0000 mg | ORAL_TABLET | Freq: Every day | ORAL | Status: DC
  Administered 2014-12-23: 40 mg via ORAL
  Filled 2014-12-23: qty 1

## 2014-12-23 MED ORDER — ATENOLOL 25 MG PO TABS
25.0000 mg | ORAL_TABLET | Freq: Every day | ORAL | Status: DC
  Administered 2014-12-24: 25 mg via ORAL
  Filled 2014-12-23: qty 1

## 2014-12-23 MED ORDER — SUCCINYLCHOLINE CHLORIDE 20 MG/ML IJ SOLN
INTRAMUSCULAR | Status: DC | PRN
  Administered 2014-12-23: 60 mg via INTRAVENOUS

## 2014-12-23 MED ORDER — LEVOTHYROXINE SODIUM 25 MCG PO TABS
125.0000 ug | ORAL_TABLET | Freq: Every day | ORAL | Status: DC
  Administered 2014-12-24: 125 ug via ORAL
  Filled 2014-12-23 (×2): qty 1

## 2014-12-23 MED ORDER — OXYCODONE HCL 5 MG/5ML PO SOLN
5.0000 mg | Freq: Once | ORAL | Status: AC | PRN
  Administered 2014-12-23: 5 mg via ORAL

## 2014-12-23 MED ORDER — ONDANSETRON HCL 4 MG PO TABS: 4.0000 mg | ORAL_TABLET | ORAL | Status: DC | PRN

## 2014-12-23 MED ORDER — ACETAMINOPHEN 160 MG/5ML PO SOLN
325.0000 mg | ORAL | Status: DC | PRN
Start: 2014-12-23 — End: 2014-12-23

## 2014-12-23 SURGICAL SUPPLY — 53 items
APPLIER CLIP 9.375 SM OPEN (CLIP)
ATTRACTOMAT 16X20 MAGNETIC DRP (DRAPES) IMPLANT
CANISTER SUCTION 2500CC (MISCELLANEOUS) ×3 IMPLANT
CLEANER TIP ELECTROSURG 2X2 (MISCELLANEOUS) ×3 IMPLANT
CLIP APPLIE 9.375 SM OPEN (CLIP) IMPLANT
CONT SPEC 4OZ CLIKSEAL STRL BL (MISCELLANEOUS) ×3 IMPLANT
CORDS BIPOLAR (ELECTRODE) ×3 IMPLANT
COVER SURGICAL LIGHT HANDLE (MISCELLANEOUS) ×3 IMPLANT
DRAIN HEMOVAC 7FR (DRAIN) ×3 IMPLANT
DRAIN SNY 10 ROU (WOUND CARE) IMPLANT
DRAPE PROXIMA HALF (DRAPES) ×6 IMPLANT
DRSG TEGADERM 2-3/8X2-3/4 SM (GAUZE/BANDAGES/DRESSINGS) ×3 IMPLANT
ELECT COATED BLADE 2.86 ST (ELECTRODE) ×3 IMPLANT
ELECT REM PT RETURN 9FT ADLT (ELECTROSURGICAL) ×3
ELECTRODE REM PT RTRN 9FT ADLT (ELECTROSURGICAL) ×1 IMPLANT
EVACUATOR SILICONE 100CC (DRAIN) ×3 IMPLANT
GAUZE SPONGE 4X4 16PLY XRAY LF (GAUZE/BANDAGES/DRESSINGS) ×3 IMPLANT
GLOVE BIOGEL M 7.0 STRL (GLOVE) ×3 IMPLANT
GLOVE BIOGEL PI IND STRL 6.5 (GLOVE) ×1 IMPLANT
GLOVE BIOGEL PI IND STRL 7.5 (GLOVE) ×1 IMPLANT
GLOVE BIOGEL PI INDICATOR 6.5 (GLOVE) ×2
GLOVE BIOGEL PI INDICATOR 7.5 (GLOVE) ×2
GLOVE ECLIPSE 8.0 STRL XLNG CF (GLOVE) ×3 IMPLANT
GLOVE SURG SS PI 7.0 STRL IVOR (GLOVE) ×3 IMPLANT
GOWN STRL REUS W/ TWL LRG LVL3 (GOWN DISPOSABLE) ×3 IMPLANT
GOWN STRL REUS W/ TWL XL LVL3 (GOWN DISPOSABLE) ×1 IMPLANT
GOWN STRL REUS W/TWL LRG LVL3 (GOWN DISPOSABLE) ×6
GOWN STRL REUS W/TWL XL LVL3 (GOWN DISPOSABLE) ×2
KIT BASIN OR (CUSTOM PROCEDURE TRAY) ×3 IMPLANT
KIT ROOM TURNOVER OR (KITS) ×3 IMPLANT
LIQUID BAND (GAUZE/BANDAGES/DRESSINGS) ×3 IMPLANT
MARKER SKIN DUAL TIP RULER LAB (MISCELLANEOUS) ×3 IMPLANT
NEEDLE 27GAX1X1/2 (NEEDLE) ×3 IMPLANT
NS IRRIG 1000ML POUR BTL (IV SOLUTION) ×3 IMPLANT
PAD ARMBOARD 7.5X6 YLW CONV (MISCELLANEOUS) ×6 IMPLANT
PENCIL BUTTON HOLSTER BLD 10FT (ELECTRODE) ×3 IMPLANT
PROBE NERVBE PRASS .33 (MISCELLANEOUS) ×3 IMPLANT
SHEARS HARMONIC 9CM CVD (BLADE) ×3 IMPLANT
SPONGE INTESTINAL PEANUT (DISPOSABLE) ×3 IMPLANT
STAPLER VISISTAT 35W (STAPLE) ×3 IMPLANT
SUT CHROMIC 2 0 SH (SUTURE) IMPLANT
SUT ETHILON 3 0 PS 1 (SUTURE) ×3 IMPLANT
SUT ETHILON 5 0 PS 2 18 (SUTURE) ×3 IMPLANT
SUT SILK 2 0 REEL (SUTURE) IMPLANT
SUT SILK 2 0 SH CR/8 (SUTURE) ×3 IMPLANT
SUT SILK 3 0 REEL (SUTURE) ×3 IMPLANT
SUT SILK 4 0 REEL (SUTURE) IMPLANT
SUT VIC AB 3-0 SH 27 (SUTURE) ×2
SUT VIC AB 3-0 SH 27X BRD (SUTURE) ×1 IMPLANT
SUT VICRYL 4-0 PS2 18IN ABS (SUTURE) ×3 IMPLANT
TOWEL OR 17X24 6PK STRL BLUE (TOWEL DISPOSABLE) ×3 IMPLANT
TRAY ENT MC OR (CUSTOM PROCEDURE TRAY) ×3 IMPLANT
TUBE ENDOTRAC EMG 7X10.2 (MISCELLANEOUS) ×3 IMPLANT

## 2014-12-23 NOTE — Anesthesia Preprocedure Evaluation (Addendum)
Anesthesia Evaluation  Patient identified by MRN, date of birth, ID band Patient awake    Reviewed: Allergy & Precautions, NPO status , Patient's Chart, lab work & pertinent test results  History of Anesthesia Complications Negative for: history of anesthetic complications  Airway Mallampati: III  TM Distance: >3 FB Neck ROM: Full    Dental  (+) Teeth Intact, Dental Advisory Given,    Pulmonary neg pulmonary ROS,  breath sounds clear to auscultation        Cardiovascular hypertension, Pt. on medications and Pt. on home beta blockers - angina- Past MI and - CHF - dysrhythmias Rhythm:Regular     Neuro/Psych negative neurological ROS  negative psych ROS   GI/Hepatic negative GI ROS, Neg liver ROS,   Endo/Other  Hypothyroidism Morbid obesityH/o thyroid Ca  Renal/GU negative Renal ROS     Musculoskeletal   Abdominal   Peds  Hematology negative hematology ROS (+)   Anesthesia Other Findings   Reproductive/Obstetrics                            Anesthesia Physical Anesthesia Plan  ASA: III  Anesthesia Plan: General   Post-op Pain Management:    Induction: Intravenous  Airway Management Planned: Oral ETT  Additional Equipment: None  Intra-op Plan:   Post-operative Plan: Extubation in OR  Informed Consent: I have reviewed the patients History and Physical, chart, labs and discussed the procedure including the risks, benefits and alternatives for the proposed anesthesia with the patient or authorized representative who has indicated his/her understanding and acceptance.   Dental advisory given  Plan Discussed with: CRNA and Surgeon  Anesthesia Plan Comments:         Anesthesia Quick Evaluation

## 2014-12-23 NOTE — Anesthesia Procedure Notes (Signed)
Procedure Name: Intubation Date/Time: 12/23/2014 9:46 AM Performed by: Terrill Mohr Pre-anesthesia Checklist: Emergency Drugs available, Patient identified, Suction available and Patient being monitored Patient Re-evaluated:Patient Re-evaluated prior to inductionOxygen Delivery Method: Circle system utilized Preoxygenation: Pre-oxygenation with 100% oxygen Intubation Type: IV induction Ventilation: Mask ventilation without difficulty Laryngoscope Size: Mac and 3 Grade View: Grade II Tube type: Oral (small cords; NIMS ETT from OR) Tube size: 7.0 mm Number of attempts: 1 Airway Equipment and Method: Stylet Placement Confirmation: ETT inserted through vocal cords under direct vision,  breath sounds checked- equal and bilateral and positive ETCO2 ETT to lip (cm): bottom of blue band on ETT just to cords. Tube secured with: Tape Dental Injury: Teeth and Oropharynx as per pre-operative assessment

## 2014-12-23 NOTE — Brief Op Note (Signed)
12/23/2014  11:31 AM  PATIENT:  Kristen Cardinal  59 y.o. female  PRE-OPERATIVE DIAGNOSIS:  papillary carcinoma of thyroid  POST-OPERATIVE DIAGNOSIS:  papillary carcinoma of thyroid  PROCEDURE:  Procedure(s) with comments: THYROIDECTOMY LEFT SIDE (Left) - Completion thyroidectomy   SURGEON:  Surgeon(s) and Role:    * Jerrell Belfast, MD - Primary    * Jodi Marble, MD - Assisting  PHYSICIAN ASSISTANT:   ASSISTANTS: Dr. Erik Obey   ANESTHESIA:   general  EBL:  Total I/O In: 8032 [I.V.:1050] Out: - 50cc  BLOOD ADMINISTERED:none  DRAINS: (7Fr) Jackson-Pratt drain(s) with closed bulb suction in the ant neck   LOCAL MEDICATIONS USED:  LIDOCAINE  and Amount: 4 ml  SPECIMEN:  Source of Specimen:  Left thyroid lobe  DISPOSITION OF SPECIMEN:  PATHOLOGY  COUNTS:  YES  TOURNIQUET:  * No tourniquets in log *  DICTATION: .Other Dictation: Dictation Number [id number]  PLAN OF CARE: Admit for overnight observation  PATIENT DISPOSITION:  PACU - hemodynamically stable.   Delay start of Pharmacological VTE agent (>24hrs) due to surgical blood loss or risk of bleeding: yes

## 2014-12-23 NOTE — Anesthesia Postprocedure Evaluation (Signed)
  Anesthesia Post-op Note  Patient: Cindy Ellis  Procedure(s) Performed: Procedure(s) with comments: THYROIDECTOMY LEFT SIDE (Left) - Completion thyroidectomy   Patient Location: PACU  Anesthesia Type:General  Level of Consciousness: awake  Airway and Oxygen Therapy: Patient Spontanous Breathing  Post-op Pain: none  Post-op Assessment: Post-op Vital signs reviewed, Patient's Cardiovascular Status Stable, Respiratory Function Stable, Patent Airway, No signs of Nausea or vomiting and Pain level controlled  Post-op Vital Signs: Reviewed and stable  Last Vitals:  Filed Vitals:   12/23/14 1431  BP: 141/75  Pulse: 67  Temp: 36.4 C  Resp: 18    Complications: No apparent anesthesia complications

## 2014-12-23 NOTE — H&P (Signed)
Cindy Ellis is an 59 y.o. female.   Chief Complaint: Thyroid Ca HPI: s/p Rt thyroid with papillary ca, pt for completion thyroid.  Past Medical History  Diagnosis Date  . Hypertension     takes Lisinopril,Atenolol,Amlodipine,and Triam-HCTZ daily  . Hyperlipidemia     takes Atorvastatin daily  . Pituitary tumor   . History of colon polyps   . History of blood transfusion     "related to hysterectomy/fibroids"  . Neoplasm of thyroid   . Anemia     hx of;when she was younger  . Iron deficiency anemia   . Cancer     thyroid cancer  . Wears glasses     Past Surgical History  Procedure Laterality Date  . Tubal ligation  1980  . Abdominal hysterectomy  1999  . Carpal tunnel release Bilateral 2014  . Cataract extraction w/ intraocular lens  implant, bilateral Bilateral 2015  . Colonoscopy    . Craniotomy N/A 04/25/2014    Procedure: CRANIOTOMY HYPOPHYSECTOMY TRANSNASAL APPROACH;  Surgeon: Hosie Spangle, MD;  Location: [facility];  Service: Neurosurgery;  Laterality: N/A;  Transphenoidal resection of pituitary tumor Dr Wilburn Cornelia to approach  . Transnasal approach N/A 04/25/2014    Procedure: TRANSNASAL APPROACH;  Surgeon: Jerrell Belfast, MD;  Location: [facility];  Service: ENT;  Laterality: N/A;  TRANSNASAL APPROACH  . Thyroidectomy, partial Right 10/12/2014    hemi  . Thyroidectomy Right 10/12/2014    Procedure: RIGHT HEMI-THYROIDECTOMY WITH NIMS MONITOR;  Surgeon: Jerrell Belfast, MD;  Location: [facility] OR;  Service: ENT;  Laterality: Right;    Family History  Problem Relation Age of Onset  . Hypertension Other    Social History:  reports that she has never smoked. She has never used smokeless tobacco. She reports that she does not drink alcohol or use illicit drugs.  Allergies: No Known Allergies  Medications Prior to Admission  Medication Sig Dispense Refill  . amLODipine (NORVASC) 5 MG tablet Take 5 mg by mouth daily.     Marland Kitchen atenolol (TENORMIN) 25 MG tablet Take 25 mg  by mouth daily.     Marland Kitchen atorvastatin (LIPITOR) 40 MG tablet Take 40 mg by mouth daily.     . Cholecalciferol (VITAMIN D3) 2000 UNITS TABS Take 1 tablet by mouth daily.    . ferrous sulfate 325 (65 FE) MG tablet Take 325 mg by mouth daily with breakfast.    . Garlic 10 MG CAPS Take 10 mg by mouth daily.    Marland Kitchen levothyroxine (SYNTHROID, LEVOTHROID) 125 MCG tablet Take 125 mcg by mouth daily before breakfast.    . lisinopril (PRINIVIL,ZESTRIL) 40 MG tablet Take 40 mg by mouth daily.     . Omega-3 Fatty Acids (FISH OIL EXTRA STRENGTH) 435 MG CAPS Take 1 capsule by mouth daily.     Marland Kitchen SM MULTIPLE VITAMINS/IRON TABS Take 1 tablet by mouth daily.     Marland Kitchen triamterene-hydrochlorothiazide (MAXZIDE-25) 37.5-25 MG per tablet Take 1 tablet by mouth daily.     . vitamin B-12 (CYANOCOBALAMIN) 500 MCG tablet Take 500 mcg by mouth daily.    Marland Kitchen HYDROcodone-acetaminophen (NORCO) 5-325 MG per tablet Take 1-2 tablets by mouth every 6 (six) hours as needed. (Patient not taking: Reported on 12/14/2014) 30 tablet 0    No results found for this or any previous visit (from the past 48 hour(s)). No results found.  Review of Systems  Constitutional: Negative.   HENT: Negative.   Respiratory: Negative.   Cardiovascular: Negative.  Gastrointestinal: Negative.     Blood pressure 133/58, pulse 69, temperature 97 F (36.1 C), temperature source Oral, resp. rate 18, weight 131.362 kg (289 lb 9.6 oz), SpO2 99 %. Physical Exam  Constitutional: She is oriented to person, place, and time. She appears well-developed and well-nourished.  Neck: Normal range of motion. Neck supple.  Ant neck scar  Cardiovascular: Normal rate.   Respiratory: Effort normal.  GI: Soft.  Musculoskeletal: Normal range of motion.  Neurological: She is alert and oriented to person, place, and time.     Assessment/Plan Adm for completion thyroidectomy.  [place], Cindy Ellis 12/23/2014, 9:27 AM

## 2014-12-23 NOTE — Transfer of Care (Signed)
Immediate Anesthesia Transfer of Care Note  Patient: Cindy Ellis  Procedure(s) Performed: Procedure(s) with comments: THYROIDECTOMY LEFT SIDE (Left) - Completion thyroidectomy   Patient Location: PACU  Anesthesia Type:General  Level of Consciousness: awake, alert  and patient cooperative  Airway & Oxygen Therapy: Patient Spontanous Breathing and Patient connected to nasal cannula oxygen  Post-op Assessment: Report given to RN, Post -op Vital signs reviewed and stable and Patient moving all extremities  Post vital signs: Reviewed and stable  Last Vitals:  Filed Vitals:   12/23/14 0808  BP: 133/58  Pulse: 69  Temp: 36.1 C  Resp: 18    Complications: No apparent anesthesia complications

## 2014-12-23 NOTE — Progress Notes (Signed)
Patient ID: Cindy Ellis, female   DOB: [birth year], 59 y.o.   MRN: [id number] Subjective: Doing well, no complaints. She is eating and drinking well.  Objective: Vital signs in last 24 hours: Temp:  [97 F (36.1 C)-97.5 F (36.4 C)] 97.5 F (36.4 C) (02/19 1431) Pulse Rate:  [63-69] 67 (02/19 1431) Resp:  [12-18] 18 (02/19 1431) BP: (133-177)/(58-80) 141/75 mmHg (02/19 1431) SpO2:  [95 %-99 %] 95 % (02/19 1431) Weight:  [131.362 kg (289 lb 9.6 oz)] 131.362 kg (289 lb 9.6 oz) (02/19 0808) Weight change:     Intake/Output from previous day:   Intake/Output this shift: Total I/O In: 1450 [I.V.:1450] Out: 18 [Drains:8; Blood:10]  PHYSICAL EXAM: Voice is normal. Drain in place. Suture line intact, no swelling.  Lab Results:  Recent Labs  12/23/14 1520  WBC 6.7  HGB 14.1  HCT 43.6  PLT 240   BMET  Recent Labs  12/23/14 1520  CREATININE 0.95    Studies/Results: No results found.  Medications: I have reviewed the patient's current medications.  Assessment/Plan: Stable postop, doing well. Plan discharge in the morning if continues to do well and if no hypocalcemia.    Cindy Ellis 12/23/2014, 4:31 PM

## 2014-12-24 ENCOUNTER — Encounter (HOSPITAL_COMMUNITY): Payer: Self-pay | Admitting: Otolaryngology

## 2014-12-24 DIAGNOSIS — C73 Malignant neoplasm of thyroid gland: Secondary | ICD-10-CM | POA: Diagnosis not present

## 2014-12-24 LAB — CALCIUM: Calcium: 9.5 mg/dL (ref 8.4–10.5)

## 2014-12-24 NOTE — Op Note (Signed)
NAMECASARA, PERRIER NO.:  [id number]  MEDICAL RECORD NO.:  [id number]  LOCATION:  6N31C                        FACILITY:  [facility]  PHYSICIAN:  Early Chars. Wilburn Cornelia, M.D.DATE OF BIRTH:  21-Jan-1956  DATE OF PROCEDURE:  12/23/2014 DATE OF DISCHARGE:                              OPERATIVE REPORT   PREOPERATIVE DIAGNOSIS: 1. Papillary carcinoma of the thyroid. 2. Status post right thyroidectomy on October 12, 2014.  POSTOPERATIVE DIAGNOSIS: 1. Papillary carcinoma of the thyroid. 2. Status post right thyroidectomy on October 12, 2014.  INDICATION FOR SURGERY: 1. Papillary carcinoma of the thyroid. 2. Status post right thyroidectomy on October 12, 2014.  SURGICAL PROCEDURE:  Completion thyroidectomy.  ANESTHESIA:  General endotracheal with NIMS.  SURGEON:  Early Chars. Wilburn Cornelia, M.D.  ASSISTANT:  Marikay Alar. Erik Obey, M.D.  COMPLICATIONS:  None.  ESTIMATED BLOOD LOSS:  Less than 50 mL.  DISPOSITION:  The patient was transferred from the operating room to the recovery room in stable condition.  BRIEF HISTORY:  The patient is a 59 year old black female who has been followed with a history of right thyroid enlargement.  The patient underwent workup including needle biopsy which showed equivocal findings with concerns raised regarding possible neoplasm.  Given the patient's history and findings which showed a relatively large right thyroid nodule, I recommended right hemithyroidectomy with tissue diagnosis. The patient underwent a right thyroidectomy on October 12, 2014. Frozen section pathology at the time of her surgery was nondiagnostic. The patient was recovered and discharged from the hospital.  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  Given that history, we recommended completion thyroidectomy in order to remove any residual thyroid tissue.  This was scheduled on elective basis on December 23, 2014.   The risks and benefits of the procedure were discussed in detail with the patient and her family and they understood and concurred with our plan for surgery which is scheduled as above.  DESCRIPTION OF PROCEDURE:  The patient was brought to the operating room on 12/23/2014, placed in supine position on the operating table. General endotracheal anesthesia was established without difficulty.  A Xomed NIMS endotracheal tube was utilized for intraoperative recurrent laryngeal nerve monitoring.  The endotracheal tube was placed and good nerve function was identified.  The NIMS monitor was used throughout the surgical procedure.  The patient was then positioned on the operating table.  She was injected with a total of 4 mL of 1% lidocaine with 1:100,000 solution of epinephrine injected in subcutaneous fashion in the preexisting thyroidectomy scar.  She was then prepped and draped.  When the patient positioned, prepared for surgery, an anterior incision was created.  Approximately 5 cm horizontally oriented incision was created in the preexisting scar from her right thyroidectomy.  This was carried through the skin underlying the subcutaneous tissue.  The strap muscles were identified in the midline.  There was mild amount of scarring from the previous surgery, but the midline was divided.  Strap muscles were lateralized and the anterior wall of the trachea was exposed.  Dissection was then carried out to allow visual and palpation inspection of the anterior compartment of the  neck (zone 6).  There was no palpable adenopathy or mass noted.  The left thyroid lobe was then identified.  The strap muscles were elevated and the left thyroid lobe was then carefully dissected.  Using a combination of bipolar cautery and Harmonic Scalpel, the blood vessels were divided including the mid thyroid vein and the inferior thyroid artery and vein.  The gland was then carefully dissected from lateral to  medial.  The recurrent laryngeal nerve was identified in its typical anatomic position and this was confirmed with the NIMS monitor.  Dissection was then carried out from inferior to superior along the superficial aspect of the recurrent laryngeal nerve, preserving the nerve throughout its course and elevating the left thyroid lobe anteriorly and superiorly.  The superior thyroid pedicle was divided with the Harmonic scalpel.  There was no active bleeding.  The gland was then carefully dissected from the trachea and removed in its entirety.  This was sent to Pathology for gross microscopic evaluation.  The wound was thoroughly irrigated and again zone 6 was inspected and palpated.  There was no evidence of adenopathy or other abnormalities.  The patient's wound was then closed after placement of a 7-French round drain at the base of the incision. The strap muscles were medialized with a single 3-0 Vicryl suture.  Deep subcutaneous closure consisted of interrupted 3-0 Vicryl suture, medial subcutaneous closure with interrupted 5-0 Vicryl suture, and final skin edge closure with Dermabond surgical glue.  Prior to closure, the left recurrent laryngeal nerve was again stimulated with the NIMS stimulator and had good function.  No evidence of nerve injury.  The patient was then awakened from anesthetic.  She was extubated and transferred from the operating room to the recovery room in stable condition.  There were no complications and estimated blood loss was less than 50 mL.          ______________________________ Early Chars. Wilburn Cornelia, M.D.     DLS/MEDQ  D:  78/67/6720  T:  12/24/2014  Job:  [id number]

## 2014-12-24 NOTE — Progress Notes (Signed)
Discharge instructions gone over with patient. Home medications gone over. Prescription for calcium given. Diet, activity, and reasons to call the doctor gone over with patient. Follow up appointment is made. My chart discussed. Signs and symptoms of infection and tetany discussed. Patient verbalized understanding of instructions.

## 2014-12-24 NOTE — Discharge Summary (Signed)
Physician Discharge Summary  Patient ID: Cindy Ellis MRN: [id number] DOB/AGE: July 19, 1956 59 y.o.  Admit date: 12/23/2014 Discharge date: 12/24/2014  Admission Diagnoses:Thyroid cancer  Discharge Diagnoses:  Active Problems:   Thyroid cancer   Discharged Condition: good  Hospital Course: no complications  Consults: none  Significant Diagnostic Studies: none  Treatments: surgery: completion thyroidectomy  Discharge Exam: Blood pressure 139/62, pulse 58, temperature 97.7 F (36.5 C), temperature source Oral, resp. rate 18, height 5' 6.5" (1.689 m), weight 154.4 kg (340 lb 6.2 oz), SpO2 98 %. PHYSICAL EXAM: Normal voice, incision excellent, JP removed, calcium normal.  Disposition: 01-Home or Self Care  Discharge Instructions    Diet - low sodium heart healthy    Complete by:  As directed      Diet - low sodium heart healthy    Complete by:  As directed      Discharge instructions    Complete by:  As directed   1. Limited activity 2. Liquid and soft diet, advance as tolerated 3. May bathe and shower day after surgery 4. Wound care - Gentle cleaning with soap and water 5. DO NOT APPLY ANY OINTMENT 6. Elevate Head of Bed     Discharge instructions    Complete by:  As directed   Hypocalcemia, Adult Hypocalcemia is low blood calcium. Calcium is important for cells to function in the body. Low blood calcium can cause a variety of symptoms and problems. CAUSES   Low levels of a body protein called albumin. Problems with the parathyroid glands or surgical removal of the parathyroid glands. The parathyroid glands maintain the body's level of calcium. Decreased production or improper use of parathyroid hormone. Lack (deficiency) of vitamin D or magnesium or both. Intestinal problems that interfere with nutrient absorption. Alcoholism. Kidney problems. Inflammation of the pancreas (pancreatitis). Certain medicines. Severe infections (sepsis). Infiltrative diseases. With  these diseases the parathyroid glands are filled with cells or substances that are not normally present. Examples include: Sarcoidosis. Hemachromatosis. Breakdown of large amounts of muscle fiber. High levels of phosphate in the body. Cancer. Massive blood transfusions which usually occur with severe trauma. SYMPTOMS   Numbness and tingling in the fingers, toes, or around the mouth. Muscle aches or cramps, especially in the legs, feet, and back. Muscle twitches. Shortness of breath or wheezing. Difficulty swallowing. Changes in the sound of the voice. General weakness. Fainting. Fast heart beats (palpitations). Chest pain. Irritability. Difficulty thinking. Memory problems or confusion. Severe fatigue. Changes in personality. Depression and anxiety. Shaking uncontrollably (seizures). Coarse, brittle hair and nails. Dry skin or lasting (chronic) skin diseases (psoriasis, eczema, or dermatitis). Clouding of the eye lens (cataracts). Abdominal cramping or pain. DIAGNOSIS   Hypocalcemia is usually diagnosed through blood tests that reveal a low level of blood calcium. Other tests, such as a recording of the electrical activity of the heart (electrocardiogram, EKG), may be performed in order to diagnose the underlying cause of the condition. TREATMENT   Treatment for hypocalcemia includes giving calcium supplements. These can be given by mouth or by intravenous (IV) access tube, depending on the severity of the symptoms and deficiency. Other minerals (electrolytes), such as magnesium, may also be given. HOME CARE INSTRUCTIONS   Meet with a dietitian to make sure you are eating the most healthful diet possible, or follow diet instructions as directed by your caregiver. Follow up with your caregiver as directed. SEEK IMMEDIATE MEDICAL CARE IF:   You develop chest pain. You develop persistent rapid or irregular heartbeats.  You have difficulty breathing. You faint. You develop  increased fatigue. You have new swelling in the feet, ankles, or legs. You develop increased muscle twitching. You start to have seizures. You develop confusion. You develop mood, memory, or personality changes. MAKE SURE YOU:   Understand these instructions. Will watch your condition. Will get help right away if you are not doing well or get worse.     Increase activity slowly    Complete by:  As directed      Increase activity slowly    Complete by:  As directed             Medication List    TAKE these medications        amLODipine 5 MG tablet  Commonly known as:  NORVASC  Take 5 mg by mouth daily.     atenolol 25 MG tablet  Commonly known as:  TENORMIN  Take 25 mg by mouth daily.     calcium-vitamin D 250-125 MG-UNIT per tablet  Commonly known as:  OSCAL  Take 1 tablet by mouth 3 (three) times daily.     ferrous sulfate 325 (65 FE) MG tablet  Take 325 mg by mouth daily with breakfast.     FISH OIL EXTRA STRENGTH 435 MG Caps  Take 1 capsule by mouth daily.     Garlic 10 MG Caps  Take 10 mg by mouth daily.     HYDROcodone-acetaminophen 5-325 MG per tablet  Commonly known as:  NORCO  Take 1-2 tablets by mouth every 6 (six) hours as needed.     levothyroxine 125 MCG tablet  Commonly known as:  SYNTHROID, LEVOTHROID  Take 125 mcg by mouth daily before breakfast.     LIPITOR 40 MG tablet  Generic drug:  atorvastatin  Take 40 mg by mouth daily.     lisinopril 40 MG tablet  Commonly known as:  PRINIVIL,ZESTRIL  Take 40 mg by mouth daily.     MAXZIDE-25 37.5-25 MG per tablet  Generic drug:  triamterene-hydrochlorothiazide  Take 1 tablet by mouth daily.     SM MULTIPLE VITAMINS/IRON Tabs  Take 1 tablet by mouth daily.     vitamin B-12 500 MCG tablet  Commonly known as:  CYANOCOBALAMIN  Take 500 mcg by mouth daily.           Follow-up Information    Follow up with SHOEMAKER, DAVID, MD In 10 days.   Specialty:  Otolaryngology   Why:  For wound  re-check   Contact information:   [address] [place] [address] [phone]       Signed: Izora Gala 12/24/2014, 10:15 AM

## 2015-02-20 ENCOUNTER — Other Ambulatory Visit (HOSPITAL_COMMUNITY): Payer: Self-pay | Admitting: Internal Medicine

## 2015-02-20 ENCOUNTER — Encounter (HOSPITAL_COMMUNITY)
Admission: RE | Admit: 2015-02-20 | Discharge: 2015-02-20 | Disposition: A | Discharge status: Home / Self Care | Payer: BC Managed Care – PPO | Source: Ambulatory Visit | Attending: Internal Medicine | Admitting: Internal Medicine

## 2015-02-20 DIAGNOSIS — C73 Malignant neoplasm of thyroid gland: Secondary | ICD-10-CM | POA: Insufficient documentation

## 2015-02-20 MED ORDER — THYROTROPIN ALFA 1.1 MG IM SOLR
0.9000 mg | INTRAMUSCULAR | Status: AC
  Administered 2015-02-20: 0.9 mg via INTRAMUSCULAR

## 2015-02-21 ENCOUNTER — Encounter (HOSPITAL_COMMUNITY)
Admission: RE | Admit: 2015-02-21 | Discharge: 2015-02-21 | Disposition: A | Discharge status: Home / Self Care | Payer: BC Managed Care – PPO | Source: Ambulatory Visit | Attending: Internal Medicine | Admitting: Internal Medicine

## 2015-02-21 DIAGNOSIS — C73 Malignant neoplasm of thyroid gland: Secondary | ICD-10-CM | POA: Diagnosis not present

## 2015-02-21 MED ORDER — THYROTROPIN ALFA 1.1 MG IM SOLR
0.9000 mg | INTRAMUSCULAR | Status: AC
  Administered 2015-02-21: 0.9 mg via INTRAMUSCULAR

## 2015-02-22 ENCOUNTER — Encounter (HOSPITAL_COMMUNITY)
Admission: RE | Admit: 2015-02-22 | Discharge: 2015-02-22 | Disposition: A | Discharge status: Home / Self Care | Payer: BC Managed Care – PPO | Source: Ambulatory Visit | Attending: Internal Medicine | Admitting: Internal Medicine

## 2015-02-22 DIAGNOSIS — C73 Malignant neoplasm of thyroid gland: Secondary | ICD-10-CM | POA: Diagnosis not present

## 2015-02-22 MED ORDER — SODIUM IODIDE I 131 CAPSULE
103.1000 | Freq: Once | INTRAVENOUS | Status: AC | PRN
  Administered 2015-02-22: 103.1 via ORAL

## 2015-03-02 ENCOUNTER — Other Ambulatory Visit (HOSPITAL_COMMUNITY): Payer: Self-pay | Admitting: Internal Medicine

## 2015-03-02 DIAGNOSIS — C73 Malignant neoplasm of thyroid gland: Secondary | ICD-10-CM

## 2015-03-03 ENCOUNTER — Ambulatory Visit (HOSPITAL_COMMUNITY)
Admission: RE | Admit: 2015-03-03 | Discharge: 2015-03-03 | Disposition: A | Discharge status: Home / Self Care | Payer: BC Managed Care – PPO | Source: Ambulatory Visit | Attending: Internal Medicine | Admitting: Internal Medicine

## 2015-03-03 DIAGNOSIS — C73 Malignant neoplasm of thyroid gland: Secondary | ICD-10-CM | POA: Insufficient documentation

## 2015-03-03 DIAGNOSIS — Z9889 Other specified postprocedural states: Secondary | ICD-10-CM | POA: Insufficient documentation

## 2015-03-31 ENCOUNTER — Other Ambulatory Visit: Payer: Self-pay

## 2015-03-31 DIAGNOSIS — Z1231 Encounter for screening mammogram for malignant neoplasm of breast: Secondary | ICD-10-CM

## 2015-05-01 ENCOUNTER — Ambulatory Visit
Admission: RE | Admit: 2015-05-01 | Discharge: 2015-05-01 | Disposition: A | Discharge status: Home / Self Care | Payer: BC Managed Care – PPO | Source: Ambulatory Visit

## 2015-05-01 DIAGNOSIS — Z1231 Encounter for screening mammogram for malignant neoplasm of breast: Secondary | ICD-10-CM

## 2015-05-02 ENCOUNTER — Ambulatory Visit: Payer: BC Managed Care – PPO

## 2015-05-18 IMAGING — RF DG SELLA TURCICA
1 series · 1 of 1 positions shown · non-contrast
Comparison: MRI of the brain 03/22/2014.

CLINICAL DATA: Pituitary tumor resection

EXAM:
SELLA TURCICA

[Series 1: run · 1 of 1 slices shown]
[im 1/1]
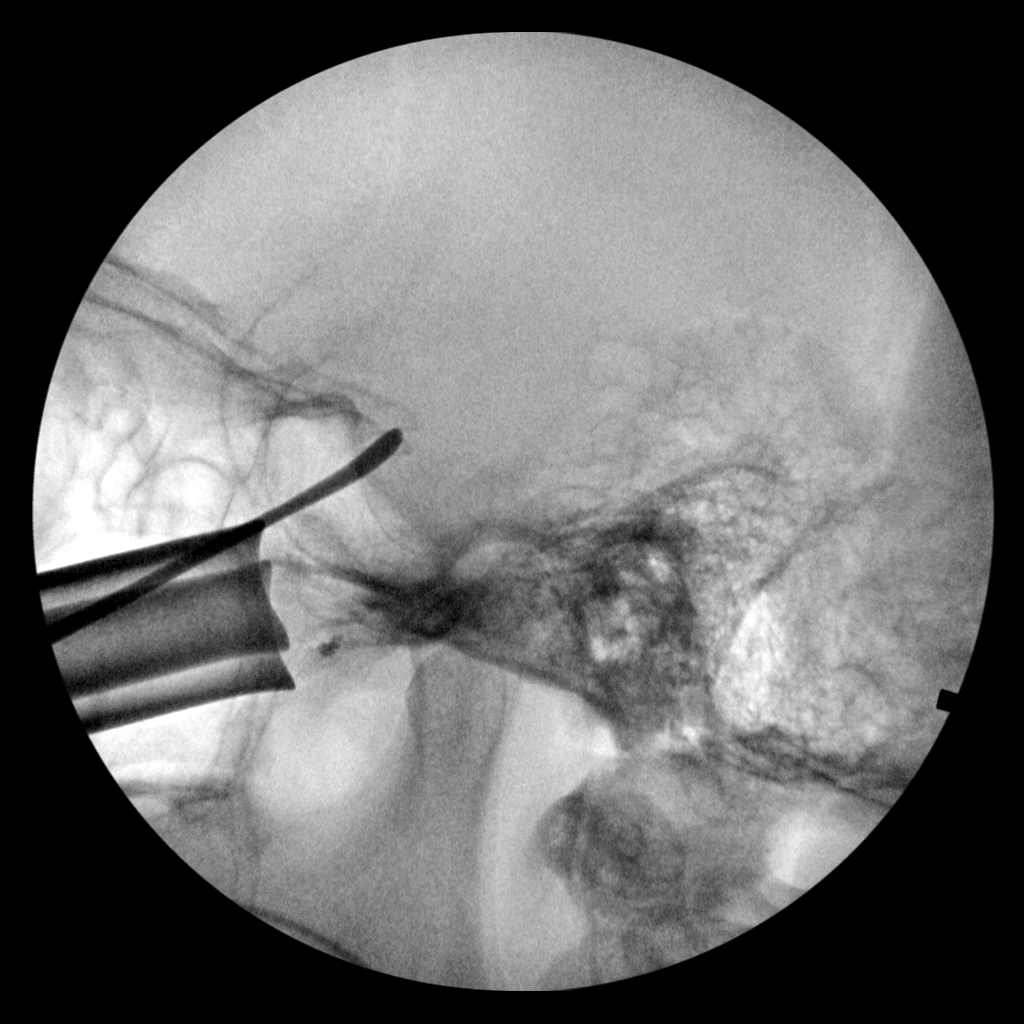

[1 of 1 positions shown; findings below may reference images not displayed]

FINDINGS: A single intraoperative view demonstrates a surgical probe at the
level of the anterior clinoid within the sella turcica.
IMPRESSION: Intraoperative localization of the sella turcica.

## 2015-08-25 ENCOUNTER — Other Ambulatory Visit: Payer: Self-pay | Admitting: Neurosurgery

## 2015-10-02 ENCOUNTER — Other Ambulatory Visit: Payer: Self-pay

## 2015-10-02 ENCOUNTER — Encounter (HOSPITAL_COMMUNITY): Payer: Self-pay

## 2015-10-02 ENCOUNTER — Encounter (HOSPITAL_COMMUNITY)
Admission: RE | Admit: 2015-10-02 | Discharge: 2015-10-02 | Disposition: A | Discharge status: Home / Self Care | Payer: BC Managed Care – PPO | Source: Ambulatory Visit | Attending: Neurosurgery | Admitting: Neurosurgery

## 2015-10-02 DIAGNOSIS — Z01818 Encounter for other preprocedural examination: Secondary | ICD-10-CM | POA: Diagnosis not present

## 2015-10-02 DIAGNOSIS — I10 Essential (primary) hypertension: Secondary | ICD-10-CM | POA: Insufficient documentation

## 2015-10-02 DIAGNOSIS — D497 Neoplasm of unspecified behavior of endocrine glands and other parts of nervous system: Secondary | ICD-10-CM | POA: Diagnosis not present

## 2015-10-02 DIAGNOSIS — R001 Bradycardia, unspecified: Secondary | ICD-10-CM | POA: Diagnosis not present

## 2015-10-02 DIAGNOSIS — Z01812 Encounter for preprocedural laboratory examination: Secondary | ICD-10-CM | POA: Diagnosis not present

## 2015-10-02 LAB — BASIC METABOLIC PANEL
Anion gap: 7 (ref 5–15)
BUN: 12 mg/dL (ref 6–20)
CO2: 26 mmol/L (ref 22–32)
Calcium: 9.8 mg/dL (ref 8.9–10.3)
Chloride: 107 mmol/L (ref 101–111)
Creatinine, Ser: 0.7 mg/dL (ref 0.44–1.00)
GFR calc Af Amer: >60 mL/min (ref >60)
GFR calc non Af Amer: >60 mL/min (ref >60)
Glucose, Bld: 96 mg/dL (ref 65–99)
Potassium: 3.8 mmol/L (ref 3.5–5.1)
Sodium: 140 mmol/L (ref 135–145)

## 2015-10-02 LAB — CBC
HCT: 45.4 % (ref 36.0–46.0)
Hemoglobin: 14.8 g/dL (ref 12.0–15.0)
MCH: 29.5 pg (ref 26.0–34.0)
MCHC: 32.6 g/dL (ref 30.0–36.0)
MCV: 90.6 fL (ref 78.0–100.0)
Platelets: 237 10*3/uL (ref 150–400)
RBC: 5.01 MIL/uL (ref 3.87–5.11)
RDW: 13.4 % (ref 11.5–15.5)
WBC: 6 10*3/uL (ref 4.0–10.5)

## 2015-10-02 NOTE — Pre-Procedure Instructions (Signed)
Jagger Woten  10/02/2015     Your procedure is scheduled on : Wednesday October 11, 2015 at 8:30 AM.  Report to [facility] Admitting at 6:30 AM.  Call this number if you have problems the morning of surgery: [phone]    Remember:  Do not eat food or drink liquids after midnight.  Take these medicines the morning of surgery with A SIP OF WATER : Amlodipine (Norvasc), Atenolol (Tenormin), Levothyroxine (Synthroid)   Please stop taking any aspirin, vitamins, herbal medications, Omega 3, Garlic, Ibuprofen, Advil, Motrin, Aleve, etc on Wednesday November 30th   Do not wear jewelry, make-up or nail polish.  Do not wear lotions, powders, or perfumes.    Do not shave 48 hours prior to surgery.    Do not bring valuables to the hospital.  [facility] is not responsible for any belongings or valuables.  Contacts, dentures or bridgework may not be worn into surgery.  Leave your suitcase in the car.  After surgery it may be brought to your room.  For patients admitted to the hospital, discharge time will be determined by your treatment team.  Patients discharged the day of surgery will not be allowed to drive home.   Name and phone number of your driver:    Special instructions:  Shower using CHG soap the night before and the morning of your surgery  Please read over the following fact sheets that you were given. Pain Booklet, Coughing and Deep Breathing and Surgical Site Infection Prevention

## 2015-10-02 NOTE — Progress Notes (Signed)
PCP is Seward Carol  Patient denied having any acute cardiac or pulmonary issues  Patient informed Nurse that she had a sleep study this past Saturday 09/30/15, however she has not received the results from the study.

## 2015-10-10 MED ORDER — DEXTROSE 5 % IV SOLN
3.0000 g | INTRAVENOUS | Status: DC
  Filled 2015-10-10 (×2): qty 3000

## 2015-10-11 ENCOUNTER — Inpatient Hospital Stay (HOSPITAL_COMMUNITY): Payer: BC Managed Care – PPO | Admitting: Anesthesiology

## 2015-10-11 ENCOUNTER — Encounter (HOSPITAL_COMMUNITY)
Admission: RE | Disposition: A | Discharge status: Home / Self Care | Payer: Self-pay | Source: Ambulatory Visit | Attending: Neurosurgery

## 2015-10-11 ENCOUNTER — Inpatient Hospital Stay (HOSPITAL_COMMUNITY)
Admission: RE | Admit: 2015-10-11 | Discharge: 2015-10-14 | DRG: 623 | Disposition: A | Discharge status: Home / Self Care | Payer: BC Managed Care – PPO | Source: Ambulatory Visit | Attending: Neurosurgery | Admitting: Neurosurgery

## 2015-10-11 ENCOUNTER — Encounter (HOSPITAL_COMMUNITY): Payer: Self-pay | Admitting: *Deleted

## 2015-10-11 ENCOUNTER — Inpatient Hospital Stay (HOSPITAL_COMMUNITY): Payer: BC Managed Care – PPO

## 2015-10-11 DIAGNOSIS — Z419 Encounter for procedure for purposes other than remedying health state, unspecified: Secondary | ICD-10-CM

## 2015-10-11 DIAGNOSIS — Z6841 Body Mass Index (BMI) 40.0 and over, adult: Secondary | ICD-10-CM

## 2015-10-11 DIAGNOSIS — R51 Headache: Secondary | ICD-10-CM | POA: Diagnosis not present

## 2015-10-11 DIAGNOSIS — Z85850 Personal history of malignant neoplasm of thyroid: Secondary | ICD-10-CM

## 2015-10-11 DIAGNOSIS — D352 Benign neoplasm of pituitary gland: Principal | ICD-10-CM | POA: Diagnosis present

## 2015-10-11 HISTORY — PX: TRANSNASAL APPROACH: SHX6149

## 2015-10-11 HISTORY — PX: CRANIOTOMY: SHX93

## 2015-10-11 LAB — TYPE AND SCREEN
ABO/RH(D): O POS
Antibody Screen: NEGATIVE

## 2015-10-11 SURGERY — CRANIOTOMY HYPOPHYSECTOMY TRANSNASAL APPROACH
Anesthesia: General

## 2015-10-11 MED ORDER — ONDANSETRON HCL 4 MG PO TABS: 4.0000 mg | ORAL_TABLET | Freq: Four times a day (QID) | ORAL | Status: DC | PRN

## 2015-10-11 MED ORDER — FENTANYL CITRATE (PF) 100 MCG/2ML IJ SOLN
INTRAMUSCULAR | Status: DC | PRN
  Administered 2015-10-11 (×7): 50 ug via INTRAVENOUS

## 2015-10-11 MED ORDER — DEXTROSE 5 % IV SOLN
2.0000 g | INTRAVENOUS | Status: DC
  Administered 2015-10-12 – 2015-10-14 (×3): 2 g via INTRAVENOUS
  Filled 2015-10-11 (×3): qty 2

## 2015-10-11 MED ORDER — EPHEDRINE SULFATE 50 MG/ML IJ SOLN
INTRAMUSCULAR | Status: AC
  Filled 2015-10-11: qty 1

## 2015-10-11 MED ORDER — OXYCODONE HCL 5 MG/5ML PO SOLN: 5.0000 mg | Freq: Once | ORAL | Status: DC | PRN

## 2015-10-11 MED ORDER — ONDANSETRON HCL 4 MG/2ML IJ SOLN
INTRAMUSCULAR | Status: DC | PRN
Start: 2015-10-11 — End: 2015-10-11
  Administered 2015-10-11: 4 mg via INTRAVENOUS

## 2015-10-11 MED ORDER — NEOSTIGMINE METHYLSULFATE 10 MG/10ML IV SOLN
INTRAVENOUS | Status: DC | PRN
  Administered 2015-10-11: 4 mg via INTRAVENOUS

## 2015-10-11 MED ORDER — ONDANSETRON HCL 4 MG/2ML IJ SOLN
INTRAMUSCULAR | Status: AC
  Filled 2015-10-11: qty 2

## 2015-10-11 MED ORDER — SODIUM CHLORIDE 0.9 % IR SOLN
Status: DC | PRN
  Administered 2015-10-11: 11:00:00

## 2015-10-11 MED ORDER — FERROUS SULFATE 325 (65 FE) MG PO TABS
325.0000 mg | ORAL_TABLET | Freq: Every day | ORAL | Status: DC
  Administered 2015-10-12 – 2015-10-14 (×3): 325 mg via ORAL
  Filled 2015-10-11 (×3): qty 1

## 2015-10-11 MED ORDER — AMLODIPINE BESYLATE 5 MG PO TABS
5.0000 mg | ORAL_TABLET | Freq: Every day | ORAL | Status: DC
Start: 2015-10-11 — End: 2015-10-14
  Administered 2015-10-11 – 2015-10-14 (×4): 5 mg via ORAL
  Filled 2015-10-11 (×4): qty 1

## 2015-10-11 MED ORDER — DEXAMETHASONE SODIUM PHOSPHATE 4 MG/ML IJ SOLN
INTRAMUSCULAR | Status: AC
  Filled 2015-10-11: qty 2

## 2015-10-11 MED ORDER — HEMOSTATIC AGENTS (NO CHARGE) OPTIME
TOPICAL | Status: DC | PRN
  Administered 2015-10-11: 1 via TOPICAL

## 2015-10-11 MED ORDER — TRIAMTERENE-HCTZ 37.5-25 MG PO TABS
1.0000 | ORAL_TABLET | Freq: Every day | ORAL | Status: DC
Start: 2015-10-11 — End: 2015-10-14
  Administered 2015-10-11 – 2015-10-14 (×4): 1 via ORAL
  Filled 2015-10-11 (×4): qty 1

## 2015-10-11 MED ORDER — NEOSTIGMINE METHYLSULFATE 10 MG/10ML IV SOLN
INTRAVENOUS | Status: AC
  Filled 2015-10-11: qty 3

## 2015-10-11 MED ORDER — MIDAZOLAM HCL 5 MG/5ML IJ SOLN
INTRAMUSCULAR | Status: DC | PRN
  Administered 2015-10-11: 2 mg via INTRAVENOUS

## 2015-10-11 MED ORDER — POTASSIUM CHLORIDE 2 MEQ/ML IV SOLN
INTRAVENOUS | Status: DC
  Administered 2015-10-11 (×4): 100 mL via INTRAVENOUS
  Filled 2015-10-11: qty 1000

## 2015-10-11 MED ORDER — WHITE PETROLATUM GEL
Status: AC
  Administered 2015-10-11: 0.2
  Filled 2015-10-11: qty 1

## 2015-10-11 MED ORDER — OXYMETAZOLINE HCL 0.05 % NA SOLN
NASAL | Status: DC | PRN
  Administered 2015-10-11: 1 via TOPICAL

## 2015-10-11 MED ORDER — LEVOTHYROXINE SODIUM 175 MCG PO TABS
175.0000 ug | ORAL_TABLET | Freq: Every day | ORAL | Status: DC
  Administered 2015-10-12 – 2015-10-14 (×3): 175 ug via ORAL
  Filled 2015-10-11 (×4): qty 1

## 2015-10-11 MED ORDER — CETYLPYRIDINIUM CHLORIDE 0.05 % MT LIQD
7.0000 mL | Freq: Two times a day (BID) | OROMUCOSAL | Status: DC
  Administered 2015-10-12 – 2015-10-14 (×5): 7 mL via OROMUCOSAL

## 2015-10-11 MED ORDER — PROPOFOL 10 MG/ML IV BOLUS
INTRAVENOUS | Status: DC | PRN
  Administered 2015-10-11: 80 mg via INTRAVENOUS

## 2015-10-11 MED ORDER — PROPOFOL 10 MG/ML IV BOLUS
INTRAVENOUS | Status: AC
  Filled 2015-10-11: qty 20

## 2015-10-11 MED ORDER — ACETAMINOPHEN 10 MG/ML IV SOLN
INTRAVENOUS | Status: AC
  Administered 2015-10-11: 1000 mg via INTRAVENOUS
  Filled 2015-10-11: qty 100

## 2015-10-11 MED ORDER — DEXTROSE 5 % IV SOLN
2.0000 g | INTRAVENOUS | Status: AC
  Administered 2015-10-11: 2 g via INTRAVENOUS
  Filled 2015-10-11: qty 2

## 2015-10-11 MED ORDER — VITAMIN D 1000 UNITS PO TABS
2000.0000 [IU] | ORAL_TABLET | Freq: Every day | ORAL | Status: DC
  Administered 2015-10-11 – 2015-10-14 (×4): 2000 [IU] via ORAL
  Filled 2015-10-11 (×4): qty 2

## 2015-10-11 MED ORDER — GLYCOPYRROLATE 0.2 MG/ML IJ SOLN
INTRAMUSCULAR | Status: AC
  Filled 2015-10-11: qty 4

## 2015-10-11 MED ORDER — HYDROCORTISONE NA SUCCINATE PF 100 MG IJ SOLR
INTRAVENOUS | Status: DC
  Administered 2015-10-11: 19:00:00 via INTRAVENOUS
  Filled 2015-10-11 (×4): qty 1000

## 2015-10-11 MED ORDER — LIDOCAINE HCL (CARDIAC) 20 MG/ML IV SOLN
INTRAVENOUS | Status: AC
  Filled 2015-10-11: qty 5

## 2015-10-11 MED ORDER — BUPIVACAINE HCL (PF) 0.25 % IJ SOLN
INTRAMUSCULAR | Status: DC | PRN
  Administered 2015-10-11: 10 mL

## 2015-10-11 MED ORDER — OXYCODONE HCL 5 MG PO TABS: 5.0000 mg | ORAL_TABLET | Freq: Once | ORAL | Status: DC | PRN

## 2015-10-11 MED ORDER — THROMBIN 20000 UNITS EX SOLR
CUTANEOUS | Status: DC | PRN
  Administered 2015-10-11: 11:00:00 via TOPICAL

## 2015-10-11 MED ORDER — LABETALOL HCL 5 MG/ML IV SOLN
5.0000 mg | INTRAVENOUS | Status: DC | PRN
  Administered 2015-10-11 (×2): 20 mg via INTRAVENOUS
  Filled 2015-10-11 (×2): qty 4

## 2015-10-11 MED ORDER — DEXTROSE 5 % IV SOLN
10.0000 mg | INTRAVENOUS | Status: DC | PRN
  Administered 2015-10-11: 20 ug/min via INTRAVENOUS

## 2015-10-11 MED ORDER — SALINE SPRAY 0.65 % NA SOLN
4.0000 | NASAL | Status: DC | PRN
  Filled 2015-10-11: qty 44

## 2015-10-11 MED ORDER — MIDAZOLAM HCL 2 MG/2ML IJ SOLN
INTRAMUSCULAR | Status: AC
  Filled 2015-10-11: qty 2

## 2015-10-11 MED ORDER — MORPHINE SULFATE (PF) 2 MG/ML IV SOLN
INTRAVENOUS | Status: AC
  Filled 2015-10-11: qty 1

## 2015-10-11 MED ORDER — SODIUM CHLORIDE 0.9 % IV SOLN
INTRAVENOUS | Status: DC | PRN
  Administered 2015-10-11: 08:00:00 via INTRAVENOUS

## 2015-10-11 MED ORDER — ATENOLOL 25 MG PO TABS
25.0000 mg | ORAL_TABLET | Freq: Every day | ORAL | Status: DC
  Administered 2015-10-12 – 2015-10-14 (×3): 25 mg via ORAL
  Filled 2015-10-11 (×3): qty 1

## 2015-10-11 MED ORDER — HYDRALAZINE HCL 20 MG/ML IJ SOLN: 5.0000 mg | INTRAMUSCULAR | Status: DC | PRN

## 2015-10-11 MED ORDER — EPHEDRINE SULFATE 50 MG/ML IJ SOLN
INTRAMUSCULAR | Status: DC | PRN
  Administered 2015-10-11 (×2): 5 mg via INTRAVENOUS

## 2015-10-11 MED ORDER — LISINOPRIL 40 MG PO TABS
40.0000 mg | ORAL_TABLET | Freq: Every day | ORAL | Status: DC
  Administered 2015-10-11 – 2015-10-14 (×4): 40 mg via ORAL
  Filled 2015-10-11 (×4): qty 1

## 2015-10-11 MED ORDER — ONDANSETRON HCL 4 MG/2ML IJ SOLN: 4.0000 mg | Freq: Four times a day (QID) | INTRAMUSCULAR | Status: DC | PRN

## 2015-10-11 MED ORDER — FENTANYL CITRATE (PF) 250 MCG/5ML IJ SOLN
INTRAMUSCULAR | Status: AC
  Filled 2015-10-11: qty 5

## 2015-10-11 MED ORDER — MAGNESIUM HYDROXIDE 400 MG/5ML PO SUSP: 30.0000 mL | Freq: Every day | ORAL | Status: DC | PRN

## 2015-10-11 MED ORDER — SODIUM CHLORIDE 0.9 % IR SOLN
Status: DC | PRN
  Administered 2015-10-11: 1000 mL

## 2015-10-11 MED ORDER — OXYCODONE-ACETAMINOPHEN 5-325 MG PO TABS
1.0000 | ORAL_TABLET | ORAL | Status: DC | PRN
  Administered 2015-10-11 – 2015-10-12 (×4): 2 via ORAL
  Filled 2015-10-11 (×4): qty 2

## 2015-10-11 MED ORDER — MUPIROCIN CALCIUM 2 % EX CREA
TOPICAL_CREAM | CUTANEOUS | Status: DC | PRN
  Administered 2015-10-11: 1 via TOPICAL

## 2015-10-11 MED ORDER — MORPHINE SULFATE (PF) 2 MG/ML IV SOLN
1.0000 mg | INTRAVENOUS | Status: DC | PRN
  Administered 2015-10-11 (×4): 2 mg via INTRAVENOUS
  Filled 2015-10-11 (×2): qty 1

## 2015-10-11 MED ORDER — VITAMIN B-12 1000 MCG PO TABS
1000.0000 ug | ORAL_TABLET | Freq: Every day | ORAL | Status: DC
  Administered 2015-10-11 – 2015-10-14 (×4): 1000 ug via ORAL
  Filled 2015-10-11 (×4): qty 1

## 2015-10-11 MED ORDER — PHENYLEPHRINE HCL 10 MG/ML IJ SOLN
INTRAMUSCULAR | Status: DC | PRN
  Administered 2015-10-11 (×2): 80 ug via INTRAVENOUS

## 2015-10-11 MED ORDER — SODIUM CHLORIDE 0.9 % IJ SOLN
INTRAMUSCULAR | Status: AC
  Filled 2015-10-11: qty 10

## 2015-10-11 MED ORDER — HYDROXYZINE HCL 25 MG PO TABS: 50.0000 mg | ORAL_TABLET | ORAL | Status: DC | PRN

## 2015-10-11 MED ORDER — LIDOCAINE HCL (CARDIAC) 20 MG/ML IV SOLN
INTRAVENOUS | Status: DC | PRN
  Administered 2015-10-11: 100 mg via INTRAVENOUS

## 2015-10-11 MED ORDER — HYDROXYZINE HCL 50 MG/ML IM SOLN: 50.0000 mg | INTRAMUSCULAR | Status: DC | PRN

## 2015-10-11 MED ORDER — BISACODYL 10 MG RE SUPP: 10.0000 mg | Freq: Every day | RECTAL | Status: DC | PRN

## 2015-10-11 MED ORDER — HYDROCODONE-ACETAMINOPHEN 5-325 MG PO TABS
1.0000 | ORAL_TABLET | ORAL | Status: DC | PRN
  Administered 2015-10-12 (×2): 2 via ORAL
  Administered 2015-10-12: 1 via ORAL
  Administered 2015-10-13: 2 via ORAL
  Administered 2015-10-13: 1 via ORAL
  Administered 2015-10-13 – 2015-10-14 (×6): 2 via ORAL
  Filled 2015-10-11 (×11): qty 2

## 2015-10-11 MED ORDER — GLYCOPYRROLATE 0.2 MG/ML IJ SOLN
INTRAMUSCULAR | Status: DC | PRN
  Administered 2015-10-11: .8 mg via INTRAVENOUS

## 2015-10-11 MED ORDER — ROCURONIUM BROMIDE 50 MG/5ML IV SOLN
INTRAVENOUS | Status: AC
  Filled 2015-10-11: qty 1

## 2015-10-11 MED ORDER — PANTOPRAZOLE SODIUM 40 MG IV SOLR
40.0000 mg | Freq: Every day | INTRAVENOUS | Status: DC
  Administered 2015-10-11: 40 mg via INTRAVENOUS
  Filled 2015-10-11: qty 40

## 2015-10-11 MED ORDER — FENTANYL CITRATE (PF) 100 MCG/2ML IJ SOLN: 25.0000 ug | INTRAMUSCULAR | Status: DC | PRN

## 2015-10-11 MED ORDER — MUPIROCIN 2 % EX OINT
TOPICAL_OINTMENT | CUTANEOUS | Status: AC
  Filled 2015-10-11: qty 22

## 2015-10-11 MED ORDER — LIDOCAINE-EPINEPHRINE 1 %-1:100000 IJ SOLN
INTRAMUSCULAR | Status: DC | PRN
  Administered 2015-10-11: 6 mL

## 2015-10-11 MED ORDER — SUCCINYLCHOLINE CHLORIDE 20 MG/ML IJ SOLN
INTRAMUSCULAR | Status: AC
  Filled 2015-10-11: qty 1

## 2015-10-11 MED ORDER — ROCURONIUM BROMIDE 100 MG/10ML IV SOLN
INTRAVENOUS | Status: DC | PRN
  Administered 2015-10-11: 50 mg via INTRAVENOUS
  Administered 2015-10-11: 10 mg via INTRAVENOUS
  Administered 2015-10-11: 30 mg via INTRAVENOUS
  Administered 2015-10-11: 50 mg via INTRAVENOUS

## 2015-10-11 MED ORDER — ROSUVASTATIN CALCIUM 20 MG PO TABS
20.0000 mg | ORAL_TABLET | Freq: Every day | ORAL | Status: DC
  Administered 2015-10-11 – 2015-10-14 (×4): 20 mg via ORAL
  Filled 2015-10-11 (×4): qty 1

## 2015-10-11 MED ORDER — PHENYLEPHRINE 40 MCG/ML (10ML) SYRINGE FOR IV PUSH (FOR BLOOD PRESSURE SUPPORT)
PREFILLED_SYRINGE | INTRAVENOUS | Status: AC
  Filled 2015-10-11: qty 10

## 2015-10-11 MED ORDER — ONDANSETRON HCL 4 MG/2ML IJ SOLN: 4.0000 mg | Freq: Once | INTRAMUSCULAR | Status: DC | PRN

## 2015-10-11 SURGICAL SUPPLY — 136 items
ATTRACTOMAT 16X20 MAGNETIC DRP (DRAPES) IMPLANT
BAG DECANTER FOR FLEXI CONT (MISCELLANEOUS) ×2 IMPLANT
BENZOIN TINCTURE PRP APPL 2/3 (GAUZE/BANDAGES/DRESSINGS) IMPLANT
BLADE EYE SICKLE 84 5 BEAV (BLADE) ×2 IMPLANT
BLADE ROTATE RAD 40 4 M4 (BLADE) IMPLANT
BLADE ROTATE TRICUT 4X13 M4 (BLADE) IMPLANT
BLADE SURG 10 STRL SS (BLADE) ×2 IMPLANT
BLADE SURG 11 STRL SS (BLADE) ×2 IMPLANT
BLADE SURG 15 STRL LF DISP TIS (BLADE) ×1 IMPLANT
BLADE SURG 15 STRL SS (BLADE) ×1
BUR NEURO DIAMOND 3X3.8 (BURR) IMPLANT
BURR NEURO DIAMOND 3X3.8 (BURR)
CANISTER SUCT 3000ML PPV (MISCELLANEOUS) ×2 IMPLANT
CATH ROBINSON RED A/P 10FR (CATHETERS) IMPLANT
CATH ROBINSON RED A/P 14FR (CATHETERS) IMPLANT
COAGULATOR SUCT SWTCH 10FR 6 (ELECTROSURGICAL) ×2 IMPLANT
CORDS BIPOLAR (ELECTRODE) ×2 IMPLANT
COTTONBALL LRG STERILE PKG (GAUZE/BANDAGES/DRESSINGS) ×2 IMPLANT
COVER BACK TABLE 60X90IN (DRAPES) ×2 IMPLANT
DECANTER SPIKE VIAL GLASS SM (MISCELLANEOUS) ×2 IMPLANT
DEPRESSOR TONGUE BLADE STERILE (MISCELLANEOUS) ×2 IMPLANT
DERMABOND ADVANCED (GAUZE/BANDAGES/DRESSINGS) ×1
DERMABOND ADVANCED .7 DNX12 (GAUZE/BANDAGES/DRESSINGS) ×1 IMPLANT
DRAIN SUBARACHNOID (WOUND CARE) IMPLANT
DRAPE C-ARM 42X72 X-RAY (DRAPES) ×4 IMPLANT
DRAPE EENT ADH APERT 15X15 STR (DRAPES) IMPLANT
DRAPE INCISE IOBAN 66X45 STRL (DRAPES) ×2 IMPLANT
DRAPE MICROSCOPE LEICA (MISCELLANEOUS) ×2 IMPLANT
DRAPE ORTHO SPLIT 77X108 STRL (DRAPES) ×1
DRAPE POUCH INSTRU U-SHP 10X18 (DRAPES) ×2 IMPLANT
DRAPE PROXIMA HALF (DRAPES) ×6 IMPLANT
DRAPE SURG ORHT 6 SPLT 77X108 (DRAPES) ×1 IMPLANT
DRESSING NASAL KENNEDY 3.5X.9 (MISCELLANEOUS) IMPLANT
DRESSING NASAL POPE 10X1.5X2.5 (GAUZE/BANDAGES/DRESSINGS) ×2 IMPLANT
DRSG NASAL KENNEDY 3.5X.9 (MISCELLANEOUS)
DRSG NASAL POPE 10X1.5X2.5 (GAUZE/BANDAGES/DRESSINGS) ×4
DURAPREP 26ML APPLICATOR (WOUND CARE) ×2 IMPLANT
DURASEAL APPLICATOR TIP (TIP) ×2 IMPLANT
DURASEAL SPINE SEALANT 3ML (MISCELLANEOUS) ×2 IMPLANT
ELECT CAUTERY BLADE 6.4 (BLADE) ×2 IMPLANT
ELECT COATED BLADE 2.86 ST (ELECTRODE) ×2 IMPLANT
ELECT NEEDLE TIP 2.8 STRL (NEEDLE) ×2 IMPLANT
ELECT REM PT RETURN 9FT ADLT (ELECTROSURGICAL) ×2
ELECTRODE REM PT RTRN 9FT ADLT (ELECTROSURGICAL) ×1 IMPLANT
FILTER ARTHROSCOPY CONVERTOR (FILTER) IMPLANT
GAUZE PACKING FOLDED 1IN STRL (GAUZE/BANDAGES/DRESSINGS) IMPLANT
GAUZE PACKING FOLDED 2  STR (GAUZE/BANDAGES/DRESSINGS)
GAUZE PACKING FOLDED 2 STR (GAUZE/BANDAGES/DRESSINGS) IMPLANT
GAUZE SPONGE 2X2 8PLY STRL LF (GAUZE/BANDAGES/DRESSINGS) IMPLANT
GAUZE SPONGE 4X4 12PLY STRL (GAUZE/BANDAGES/DRESSINGS) ×2 IMPLANT
GLOVE BIO SURGEON STRL SZ7.5 (GLOVE) ×2 IMPLANT
GLOVE BIO SURGEON STRL SZ8 (GLOVE) ×2 IMPLANT
GLOVE BIOGEL M 7.0 STRL (GLOVE) ×4 IMPLANT
GLOVE BIOGEL PI IND STRL 7.0 (GLOVE) ×6 IMPLANT
GLOVE BIOGEL PI IND STRL 7.5 (GLOVE) ×3 IMPLANT
GLOVE BIOGEL PI IND STRL 8 (GLOVE) ×1 IMPLANT
GLOVE BIOGEL PI INDICATOR 7.0 (GLOVE) ×6
GLOVE BIOGEL PI INDICATOR 7.5 (GLOVE) ×3
GLOVE BIOGEL PI INDICATOR 8 (GLOVE) ×1
GLOVE ECLIPSE 7.5 STRL STRAW (GLOVE) ×4 IMPLANT
GLOVE EXAM NITRILE LRG STRL (GLOVE) IMPLANT
GLOVE EXAM NITRILE MD LF STRL (GLOVE) IMPLANT
GLOVE EXAM NITRILE XL STR (GLOVE) IMPLANT
GLOVE EXAM NITRILE XS STR PU (GLOVE) IMPLANT
GOWN STRL NON-REIN LRG LVL3 (GOWN DISPOSABLE) IMPLANT
GOWN STRL REUS W/ TWL LRG LVL3 (GOWN DISPOSABLE) ×3 IMPLANT
GOWN STRL REUS W/ TWL XL LVL3 (GOWN DISPOSABLE) ×1 IMPLANT
GOWN STRL REUS W/TWL 2XL LVL3 (GOWN DISPOSABLE) IMPLANT
GOWN STRL REUS W/TWL LRG LVL3 (GOWN DISPOSABLE) ×3
GOWN STRL REUS W/TWL XL LVL3 (GOWN DISPOSABLE) ×1
GRAFT DURAGEN MATRIX 1WX1L (Tissue) ×2 IMPLANT
HEMOSTAT SURGICEL 2X14 (HEMOSTASIS) IMPLANT
IV EXTENSION SET ×2 IMPLANT
KIT BASIN OR (CUSTOM PROCEDURE TRAY) ×2 IMPLANT
KIT ROOM TURNOVER OR (KITS) ×2 IMPLANT
MARKER SKIN DUAL TIP RULER LAB (MISCELLANEOUS) ×4 IMPLANT
NEEDLE 18GX1X1/2 (RX/OR ONLY) (NEEDLE) IMPLANT
NEEDLE HYPO 25X1 1.5 SAFETY (NEEDLE) ×2 IMPLANT
NEEDLE SPNL 22GX3.5 QUINCKE BK (NEEDLE) ×2 IMPLANT
NS IRRIG 1000ML POUR BTL (IV SOLUTION) ×2 IMPLANT
PAD ARMBOARD 7.5X6 YLW CONV (MISCELLANEOUS) ×4 IMPLANT
PAD ENT ADHESIVE 25PK (MISCELLANEOUS) IMPLANT
PATTIES SURGICAL .25X.25 (GAUZE/BANDAGES/DRESSINGS) IMPLANT
PATTIES SURGICAL .5 X.5 (GAUZE/BANDAGES/DRESSINGS) ×2 IMPLANT
PATTIES SURGICAL .5 X3 (DISPOSABLE) ×2 IMPLANT
PENCIL BUTTON HOLSTER BLD 10FT (ELECTRODE) ×4 IMPLANT
RUBBERBAND STERILE (MISCELLANEOUS) ×4 IMPLANT
SHEET SIL 040 (INSTRUMENTS) IMPLANT
SPECIMEN JAR SMALL (MISCELLANEOUS) ×2 IMPLANT
SPLINT NASAL DOYLE BI-VL (GAUZE/BANDAGES/DRESSINGS) ×2 IMPLANT
SPONGE GAUZE 2X2 STER 10/PKG (GAUZE/BANDAGES/DRESSINGS)
SPONGE LAP 4X18 X RAY DECT (DISPOSABLE) ×2 IMPLANT
SPONGE NEURO XRAY DETECT 1X3 (DISPOSABLE) ×2 IMPLANT
SPONGE SURGIFOAM ABS GEL 100 (HEMOSTASIS) ×2 IMPLANT
SPONGE SURGIFOAM ABS GEL 12-7 (HEMOSTASIS) IMPLANT
STAPLER SKIN PROX WIDE 3.9 (STAPLE) ×2 IMPLANT
STRIP CLOSURE SKIN 1/2X4 (GAUZE/BANDAGES/DRESSINGS) IMPLANT
STRIP CLOSURE SKIN 1/4X4 (GAUZE/BANDAGES/DRESSINGS) IMPLANT
SUT BONE WAX W31G (SUTURE) ×2 IMPLANT
SUT CHROMIC 3 0 PS 2 (SUTURE) IMPLANT
SUT CHROMIC 4 0 P 3 18 (SUTURE) IMPLANT
SUT ETHILON 3 0 FSL (SUTURE) IMPLANT
SUT ETHILON 3 0 PS 1 (SUTURE) ×2 IMPLANT
SUT ETHILON 4 0 PS 2 18 (SUTURE) ×2 IMPLANT
SUT ETHILON 6 0 P 1 (SUTURE) ×2 IMPLANT
SUT NOVAFIL 6 0 PRE 2 4412 13 (SUTURE) IMPLANT
SUT PDS AB 4-0 RB1 27 (SUTURE) ×2 IMPLANT
SUT PLAIN 4 0 ~~LOC~~ 1 (SUTURE) ×2 IMPLANT
SUT PROLENE 6 0 BV (SUTURE) IMPLANT
SUT VIC AB 2-0 CP2 18 (SUTURE) ×2 IMPLANT
SUT VIC AB 2-0 CT1 27 (SUTURE)
SUT VIC AB 2-0 CT1 27XBRD (SUTURE) IMPLANT
SUT VIC AB 3-0 SH 8-18 (SUTURE) ×2 IMPLANT
SUT VIC AB 4-0 P-3 18X BRD (SUTURE) IMPLANT
SUT VIC AB 4-0 P3 18 (SUTURE)
SUT VIC AB 5-0 P-3 18XBRD (SUTURE) ×1 IMPLANT
SUT VIC AB 5-0 P3 18 (SUTURE) ×1
SWAB COLLECTION DEVICE MRSA (MISCELLANEOUS) IMPLANT
SYR 5ML LL (SYRINGE) ×2 IMPLANT
SYR BULB 3OZ (MISCELLANEOUS) IMPLANT
SYR CONTROL 10ML LL (SYRINGE) ×4 IMPLANT
SYR TB 1ML 25GX5/8 (SYRINGE) IMPLANT
TOWEL OR 17X24 6PK STRL BLUE (TOWEL DISPOSABLE) ×2 IMPLANT
TOWEL OR 17X26 10 PK STRL BLUE (TOWEL DISPOSABLE) ×2 IMPLANT
TRACKER ENT INSTRUMENT (MISCELLANEOUS) ×2 IMPLANT
TRACKER ENT PATIENT (MISCELLANEOUS) ×2 IMPLANT
TRAP SPECIMEN MUCOUS 40CC (MISCELLANEOUS) IMPLANT
TRAY ENT MC OR (CUSTOM PROCEDURE TRAY) ×2 IMPLANT
TRAY FOLEY W/METER SILVER 16FR (SET/KITS/TRAYS/PACK) IMPLANT
TUBE ANAEROBIC SPECIMEN COL (MISCELLANEOUS) IMPLANT
TUBE CONNECTING 12X1/4 (SUCTIONS) ×2 IMPLANT
TUBING CONNECTOR 18X5MM (MISCELLANEOUS) IMPLANT
TUBING STRAIGHTSHOT EPS 5PK (TUBING) IMPLANT
UNDERPAD 30X30 INCONTINENT (UNDERPADS AND DIAPERS) IMPLANT
WATER STERILE IRR 1000ML POUR (IV SOLUTION) ×2 IMPLANT
WIPE INSTRUMENT VISIWIPE 73X73 (MISCELLANEOUS) IMPLANT

## 2015-10-11 NOTE — Anesthesia Procedure Notes (Addendum)
Procedure Name: Intubation Date/Time: 10/11/2015 8:56 AM Performed by: Kyung Rudd Pre-anesthesia Checklist: Patient identified, Emergency Drugs available, Suction available, Patient being monitored and Timeout performed Patient Re-evaluated:Patient Re-evaluated prior to inductionOxygen Delivery Method: Circle system utilized Preoxygenation: Pre-oxygenation with 100% oxygen Intubation Type: IV induction Ventilation: Mask ventilation without difficulty Laryngoscope Size: Mac and 3 Grade View: Grade I Tube type: Oral Tube size: 7.0 mm Number of attempts: 1 Airway Equipment and Method: Stylet Placement Confirmation: ETT inserted through vocal cords under direct vision,  positive ETCO2 and breath sounds checked- equal and bilateral Secured at: 22 cm Tube secured with: Tape Dental Injury: Teeth and Oropharynx as per pre-operative assessment  Comments: AOI per Avis Epley, SRNA with Dr. Jillyn Hidden supervising.

## 2015-10-11 NOTE — Anesthesia Postprocedure Evaluation (Signed)
Anesthesia Post Note  Patient: [name]  Procedure(s) Performed: Procedure(s) (LRB): Transphenoidal resection of pituitary tumor (N/A) TRANSNASAL APPROACH (N/A)  Patient location during evaluation: PACU Anesthesia Type: General Level of consciousness: awake and alert Pain management: pain level controlled Vital Signs Assessment: post-procedure vital signs reviewed and stable Respiratory status: spontaneous breathing, nonlabored ventilation, respiratory function stable and patient connected to nasal cannula oxygen Cardiovascular status: blood pressure returned to baseline and stable Postop Assessment: no signs of nausea or vomiting Anesthetic complications: no    Last Vitals:  Filed Vitals:   10/11/15 1322 10/11/15 1400  BP:  147/69  Pulse: 75 58  Temp:    Resp: 15 14    Last Pain:  Filed Vitals:   10/11/15 1414  PainSc: 10-Worst pain ever                 [name]

## 2015-10-11 NOTE — Op Note (Signed)
10/11/2015  11:57 AM  PATIENT:  Cindy Ellis  59 y.o. female  PRE-OPERATIVE DIAGNOSIS:  Recurrent pituitary tumor  POST-OPERATIVE DIAGNOSIS:  Recurrent pituitary tumor  PROCEDURE:  Procedure(s):  Transphenoidal resection of pituitary tumor with microdissection and microsurgical technique with intraoperative frameless stereotaxis using the [employer] Fusion, Harvesting of adipose autograft from the abdominal wall  TRANSNASAL APPROACH  SURGEON:  Surgeon(s): Jovita Gamma, MD Jerrell Belfast, MD Eustace Moore, MD  ASSISTANTS:David Wilburn Cornelia, M.D., Sherley Bounds, M.D.   ANESTHESIA:   general  EBL:  Total I/O In: -  Out: 225 [Urine:175; Blood:50]  BLOOD ADMINISTERED:none  COUNT:  Correct per nursing staff   DICTATION:  The surgery was done in a co-surgeon fashion between myself from the neurosurgical service and Dr. Jerrell Belfast from the ENT service. This is a dictation of the neurosurgical portion of the procedure, Dr. Wilburn Cornelia will be dictating the ENT portion. Patient was brought to the operating room, placed under general endotracheal anesthesia. Patient was positioned in a horseshoe head rest, and the C-arm fluoroscope and the intraoperative  frameless stereotaxis were set up. The abdomen was prepped with DuraPrep and draped in a sterile fashion.  Dr. Wilburn Cornelia  then proceeded with the prep and approach to the transseptal, transsphenoidal approach to the sphenoid sinus and sella. This portion of procedure is dictated separately by Dr. Wilburn Cornelia. Once the approach was completed, I scrubbed into the case and took over primary surgeon responsibility.  The operating microscope was draped important in the field to provide additional magnification, illumination, and visualization, and the remainder of the tumor approach and resection was performed using microdissection and microsurgical technique.  We first exposed the prepped abdomen, and infiltrated the skin and subcutaneous tissue with  local site with epinephrine. A horizontal incision was made on the right side of the abdomen, and dissection was carried down to the subcutaneous tissue. Small pledgets of adipose tissue was then harvested and placed in saline, to be later implanted as autograft. The wound was irrigated with bacitracin solution, hemostasis was established with electrocautery, and then we proceeded with closure. The subcutaneous and subcuticular closed interrupted inverted 2-0 Vicryl sutures. The skin is approximate Dermabond. The sella was identified both with C-arm and frameless stereotaxis, as well as by direct vision. The previous anterior sellar repair was identified. The bone had been placed in the bone defect had fused with the edges of the previous bony exposure. We carefully removed the anterior bony wall of the sella, which was densely adherent to the underlying dura, and therefore we immediately visualize the adipose autograft that had been implanted at the previous tumor resection.  Within identified the tumor capsule, it was moderately thickened, and was incised with an 11 scalpel. We then proceeded with tumor resection using a variety of small ring curettes. Specimens of tumor were placed and saline, to be later placed in formalin, and sent to pathology for permanent pathologic exam. As the tumor was removed, CSF drained into the exposure. We then continued the tumor resection, until no further tumor could be mobilized are removed.  There was good pulsation of the tumor capsule, and the portion of the tumor capsule directly visualized, was removed as well. We then packed the resection cavity with a small pledget of adipose autograft. This CSF leakage stopped. A pledget of DuraGen was placed over that, and then DuraSeal was sprayed over the the DuraGen and the margins of the bony exposure into the sella. No CSF leakage was seen at this time.  Dr. Wilburn Cornelia then scrubbed back into the case to take over primary surgical  responsibility for the closure. Subsequently the patient is to be reversed the anesthetic, axillary, and transferred to the recovery room for further care. She is to be subsequent transferred to the intensive care unit for further care.   PLAN OF CARE: Admit to inpatient   PATIENT DISPOSITION:  ICU - extubated and stable.   Delay start of Pharmacological VTE agent (>24hrs) due to surgical blood loss or risk of bleeding:  yes

## 2015-10-11 NOTE — Brief Op Note (Signed)
10/11/2015  12:40 PM  PATIENT:  Cindy Ellis  59 y.o. female  PRE-OPERATIVE DIAGNOSIS:  pituitary tumor  POST-OPERATIVE DIAGNOSIS:  pituitary tumor  PROCEDURE:  Procedure(s) with comments: Transphenoidal resection of pituitary tumor (N/A) - Transphenoidal resection of pituitary tumor TRANSNASAL APPROACH (N/A)  SURGEON:  Surgeon(s) and Role: Panel 1:    * Jovita Gamma, MD - Primary    * Eustace Moore, MD - Assisting  Panel 2:    * Jerrell Belfast, MD - Primary  PHYSICIAN ASSISTANT:   ASSISTANTS: none   ANESTHESIA:   general  EBL:  Total I/O In: 700 [I.V.:700] Out: 250 [Urine:175; Blood:75]  BLOOD ADMINISTERED:none  DRAINS: none   LOCAL MEDICATIONS USED:  LIDOCAINE  and Amount: 6 ml  SPECIMEN:  Source of Specimen:  Pituitary tumor  DISPOSITION OF SPECIMEN:  PATHOLOGY  COUNTS:  YES  TOURNIQUET:  * No tourniquets in log *  DICTATION: .Other Dictation: Dictation Number [id number]  PLAN OF CARE: Admit to inpatient   PATIENT DISPOSITION:  PACU - hemodynamically stable.   Delay start of Pharmacological VTE agent (>24hrs) due to surgical blood loss or risk of bleeding: no

## 2015-10-11 NOTE — Progress Notes (Signed)
   ENT Progress Note: Procedure(s): Transphenoidal resection of pituitary tumor TRANSNASAL APPROACH   Subjective: Asymptomatic  Objective: Vital signs in last 24 hours: Temp:  [97.5 F (36.4 C)] 97.5 F (36.4 C) (12/07 0706) Pulse Rate:  [70] 70 (12/07 0706) Resp:  [20] 20 (12/07 0706) BP: (121)/(55) 121/55 mmHg (12/07 0706) SpO2:  [99 %] 99 % (12/07 0706) Weight:  [127.914 kg (282 lb)] 127.914 kg (282 lb) (12/07 0706) Weight change:     Intake/Output from previous day:   Intake/Output this shift:    Labs: No results for input(s): WBC, HGB, HCT, PLT in the last 72 hours. No results for input(s): NA, K, CL, CO2, GLUCOSE, BUN, CALCIUM in the last 72 hours.  Invalid input(s): CREATININR  Studies/Results: No results found.   PHYSICAL EXAM: Patent anterior nasal passage Min scarring nasal septum   Assessment/Plan: Adm for Revision Trans-septal Trans-sphenoidal Pituitary Resection    Cindy Ellis 10/11/2015, 8:36 AM

## 2015-10-11 NOTE — Progress Notes (Signed)
Subjective: Patient more comfortable, headache decreased, particularly if she is still. Has tolerated clear liquids, and is taking Percocet, which has helped with headache.  Urine output and specific gravities stable.  Objective: Vital signs in last 24 hours: Filed Vitals:   10/11/15 1558 10/11/15 1700 10/11/15 1704 10/11/15 1800  BP: 149/78 164/71 164/71 165/81  Pulse:  72  72  Temp: 97.7 F (36.5 C)     TempSrc: Oral     Resp:  22  14  Height:      Weight:      SpO2:  100%  100%     Intake/Output this shift: Total I/O In: 1125 [I.V.:1125] Out: 625 [Urine:550; Blood:75]  Physical Exam:  Awake and alert, oriented. Following commands. Moving all 4 extremity is well.   Assessment/Plan: Doing well initially following surgery. We'll begin gradual taper of hydrocortisone drip.   Hosie Spangle, MD 10/11/2015, 6:27 PM

## 2015-10-11 NOTE — Anesthesia Preprocedure Evaluation (Addendum)
Anesthesia Evaluation  Patient identified by MRN, date of birth, ID band Patient awake    Reviewed: Allergy & Precautions, H&P , NPO status , Patient's Chart, lab work & pertinent test results  Airway Mallampati: II  TM Distance: >3 FB Neck ROM: Full    Dental no notable dental hx. (+) Dental Advisory Given   Pulmonary neg pulmonary ROS,    Pulmonary exam normal breath sounds clear to auscultation       Cardiovascular hypertension, Pt. on medications and Pt. on home beta blockers Normal cardiovascular exam Rhythm:Regular     Neuro/Psych  Headaches, negative psych ROS   GI/Hepatic negative GI ROS, Neg liver ROS,   Endo/Other  Morbid obesity  Renal/GU negative Renal ROS     Musculoskeletal negative musculoskeletal ROS (+)   Abdominal (+) + obese,   Peds  Hematology   Anesthesia Other Findings   Reproductive/Obstetrics negative OB ROS                          Anesthesia Physical  Anesthesia Plan  ASA: III  Anesthesia Plan: General   Post-op Pain Management:    Induction: Intravenous  Airway Management Planned: Oral ETT  Additional Equipment: Arterial line  Intra-op Plan:   Post-operative Plan:   Informed Consent: I have reviewed the patients History and Physical, chart, labs and discussed the procedure including the risks, benefits and alternatives for the proposed anesthesia with the patient or authorized representative who has indicated his/her understanding and acceptance.   Dental advisory given  Plan Discussed with: CRNA and Anesthesiologist  Anesthesia Plan Comments:        Anesthesia Quick Evaluation

## 2015-10-11 NOTE — H&P (Signed)
Subjective: Patient is a 59 y.o. right-handed black female who is admitted for treatment of recurrent pituitary adenoma.  Patient is one a half years status post transsphenoidal resection of a large pituitary macroadenoma. She did well following that surgery, but follow-up MRI showed recurrent tumor that abuts the left optic nerves. It does extend as well into the left cavernous sinus. The patient is not a candidate for SRS at this time due to the tumor abutting the optic nerve and therefore she is brought to surgery for transsphenoidal resection of the recurrent tumor, with the goal of decompressing the optic nerve and creating adequate space to be able to proceed with subsequent SRS. The patient is on thyroid replacement, having undergone a complete thyroidectomy last year for separate problem.   Patient Active Problem List   Diagnosis Date Noted  . Thyroid cancer ([place]) 12/23/2014  . Thyroid mass 10/12/2014    Class: Chronic  . 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 Hypertension     takes Lisinopril,Atenolol,Amlodipine,and Triam-HCTZ daily  . Hyperlipidemia     takes Atorvastatin daily  . Pituitary tumor ([place])   . History of colon polyps   . History of blood transfusion     "related to hysterectomy/fibroids"  . Neoplasm of thyroid   . Anemia     hx of;when she was younger  . Iron deficiency anemia   . Cancer [facility])     thyroid cancer  . Wears glasses     Past Surgical History  Procedure Laterality Date  . Tubal ligation  1980  . Abdominal hysterectomy  1999  . Carpal tunnel release Bilateral 2014  . Cataract extraction w/ intraocular lens  implant, bilateral Bilateral 2015  . Colonoscopy    . Craniotomy N/A 04/25/2014    Procedure: CRANIOTOMY HYPOPHYSECTOMY TRANSNASAL APPROACH;  Surgeon: Hosie Spangle, MD;  Location: [place] NEURO ORS;  Service: Neurosurgery;  Laterality: N/A;  Transphenoidal resection of pituitary  tumor Dr Wilburn Cornelia to approach  . Transnasal approach N/A 04/25/2014    Procedure: TRANSNASAL APPROACH;  Surgeon: Jerrell Belfast, MD;  Location: [facility] NEURO ORS;  Service: ENT;  Laterality: N/A;  TRANSNASAL APPROACH  . Thyroidectomy, partial Right 10/12/2014    hemi  . Thyroidectomy Right 10/12/2014    Procedure: RIGHT HEMI-THYROIDECTOMY WITH NIMS MONITOR;  Surgeon: Jerrell Belfast, MD;  Location: [place];  Service: ENT;  Laterality: Right;  . Thyroidectomy, partial Left 12/23/2014    DR SHOEMAKER  . Thyroidectomy Left 12/23/2014    Procedure: THYROIDECTOMY LEFT SIDE;  Surgeon: Jerrell Belfast, MD;  Location: [place];  Service: ENT;  Laterality: Left;  Completion thyroidectomy     Prescriptions prior to admission  Medication Sig Dispense Refill Last Dose  . amLODipine (NORVASC) 5 MG tablet Take 5 mg by mouth daily.    12/23/2014 at 0615  . atenolol (TENORMIN) 25 MG tablet Take 25 mg by mouth daily.    10/11/2015 at 0500  . Calcium Carb-Cholecalciferol (CALCIUM 600/VITAMIN D3) 600-800 MG-UNIT TABS Take 2 capsules by mouth daily.   Past Week at Unknown time  . Cholecalciferol (VITAMIN D3) 2000 UNITS capsule Take 2,000 Units by mouth daily.   Past Week at Unknown time  . ferrous sulfate 325 (65 FE) MG tablet Take 325 mg by mouth daily with breakfast.   Past Week at Unknown time  . Garlic 123XX123 MG CAPS Take 1,000 mg by mouth daily.   Past Week at Unknown time  .  levothyroxine (SYNTHROID, LEVOTHROID) 175 MCG tablet Take 175 mcg by mouth daily before breakfast.   10/11/2015 at 0500  . lisinopril (PRINIVIL,ZESTRIL) 40 MG tablet Take 40 mg by mouth daily.    10/10/2015 at Unknown time  . Omega 3 1000 MG CAPS Take 1,000 mg by mouth daily.   Past Week at Unknown time  . rosuvastatin (CRESTOR) 20 MG tablet Take 20 mg by mouth daily.   10/10/2015 at Unknown time  . SM MULTIPLE VITAMINS/IRON TABS Take 1 tablet by mouth daily.    Past Week at Unknown time  . triamterene-hydrochlorothiazide (MAXZIDE-25) 37.5-25 MG per  tablet Take 1 tablet by mouth daily.    12/22/2014 at Unknown time  . vitamin B-12 (CYANOCOBALAMIN) 1000 MCG tablet Take 1,000 mcg by mouth daily.   Past Month at Unknown time  . calcium-vitamin D (OSCAL) 250-125 MG-UNIT per tablet Take 1 tablet by mouth 3 (three) times daily. (Patient not taking: Reported on 09/29/2015) 60 tablet 1 Not Taking at Unknown time  . HYDROcodone-acetaminophen (NORCO) 5-325 MG per tablet Take 1-2 tablets by mouth every 6 (six) hours as needed. (Patient not taking: Reported on 12/14/2014) 30 tablet 0 Not Taking at Unknown time   No Known Allergies  Social History  Substance Use Topics  . Smoking status: Never Smoker   . Smokeless tobacco: Never Used  . Alcohol Use: No    Family History  Problem Relation Age of Onset  . Hypertension Other      Review of Systems A comprehensive review of systems was negative.  Objective: Vital signs in last 24 hours: Temp:  [97.5 F (36.4 C)] 97.5 F (36.4 C) (12/07 0706) Pulse Rate:  [70] 70 (12/07 0706) Resp:  [20] 20 (12/07 0706) BP: (121)/(55) 121/55 mmHg (12/07 0706) SpO2:  [99 %] 99 % (12/07 0706) Weight:  [127.914 kg (282 lb)] 127.914 kg (282 lb) (12/07 0706)  EXAM: Patient is a well-developed well-nourished black female in no acute distress. Lungs are clear to auscultation , the patient has symmetrical respiratory excursion. Heart has a regular rate and rhythm normal S1 and S2 no murmur.   Abdomen is soft nontender nondistended bowel sounds are present. Extremity examination shows no clubbing cyanosis or edema. Mental status shows patient is awake and alert, oriented fully. Cranial nerves show pupils are equal, round, reactive to light. EOMI. Facial sensation intact. Facial movements symmetrical. Hearing is present body. Palatal movements present bilaterally. Shoulder shrug is symmetrical. Tongue is midline. Motor examination shows 5/5 strength in the upper and lower extremities. Sensation is intact to pinprick  throughout. Reflexes are symmetrical. She has a normal gait and stance.  Data Review:CBC    Component Value Date/Time   WBC 6.0 10/02/2015 1218   RBC 5.01 10/02/2015 1218   HGB 14.8 10/02/2015 1218   HCT 45.4 10/02/2015 1218   PLT 237 10/02/2015 1218   MCV 90.6 10/02/2015 1218   MCH 29.5 10/02/2015 1218   MCHC 32.6 10/02/2015 1218   RDW 13.4 10/02/2015 1218   LYMPHSABS 1.1 04/26/2014 0155   MONOABS 0.4 04/26/2014 0155   EOSABS 0.0 04/26/2014 0155   BASOSABS 0.0 04/26/2014 0155                          BMET    Component Value Date/Time   NA 140 10/02/2015 1218   K 3.8 10/02/2015 1218   CL 107 10/02/2015 1218   CO2 26 10/02/2015 1218   GLUCOSE 96 10/02/2015  1218   BUN 12 10/02/2015 1218   CREATININE 0.70 10/02/2015 1218   CALCIUM 9.8 10/02/2015 1218   GFRNONAA >60 10/02/2015 1218   GFRAA >60 10/02/2015 1218     Assessment/Plan: Patient with recurrent pituitary adenoma, that abuts the left optic nerve, and extends into the left cavernous sinus. She is admitted now for transsphenoidal resection of the sellar and suprasellar portion of the tumor, recognizing that we will not be able to extend the resection into the cavernous sinus. Participation is that if we can achieve adequate space to the tumor and the optic nerve, we will be able to simply proceed with SRS. I've discussed the nature the patient's condition, the nature the surgical procedure, and risks of surgery including infection, bleeding, possibly for transfusion, the risk of neurologic dysfunction including loss of vision, paralysis, coma, and death, and risks of CSF leakage and possibly for further surgery, risk of residual tumor requiring further surgery, and anesthetic risks of myocardial infarction, stroke, pneumonia, and death. The surgery will be done in a co-surgeon fashion between myself and Dr. Jerrell Belfast from the ENT service. Patient is also followed by Dr. Dagmar Hait from endocrinology.   Hosie Spangle,  MD 10/11/2015 7:47 AM

## 2015-10-11 NOTE — Progress Notes (Signed)
Subjective: Patient resting in bed, head of bed at about 45. Having significant headache, morphine lasting only about half an hour. Denies nausea, has had no vomiting.  Objective: Vital signs in last 24 hours: Filed Vitals:   10/11/15 1400 10/11/15 1430 10/11/15 1500 10/11/15 1538  BP: 147/69 151/77 147/80   Pulse: 58 72 66 73  Temp:      TempSrc:      Resp: 14 17 14 17   Height:      Weight:      SpO2: 100% 100% 100% 100%    Intake/Output from previous day:   Intake/Output this shift: Total I/O In: 825 [I.V.:825] Out: 325 [Urine:250; Blood:75]  Physical Exam:  Awake and alert, oriented. Following commands. Moving all 4 extremities well.  Studies/Results: Dg Skull 1-3 Views  10/11/2015  CLINICAL DATA:  Trans-sphenoidal pituitary tumor resection. EXAM: DG C-ARM 61-120 MIN; SKULL - 1-3 VIEW COMPARISON:  MRI 08/16/2015 FINDINGS: Lateral intraoperative of skull images demonstrate surgical instrument extending from anterior transsphenoidal approach to the sellar region for pituitary tumor resection. Recommend correlation with findings at the time of the procedure. IMPRESSION: Surgical instrument extending from anterior transsphenoidal approach with tip over the sellar region for presumed pituitary tumor resection. Electronically Signed   By: Marin Olp M.D.   On: 10/11/2015 12:50   Dg C-arm 1-60 Min  10/11/2015  CLINICAL DATA:  Trans-sphenoidal pituitary tumor resection. EXAM: DG C-ARM 61-120 MIN; SKULL - 1-3 VIEW COMPARISON:  MRI 08/16/2015 FINDINGS: Lateral intraoperative of skull images demonstrate surgical instrument extending from anterior transsphenoidal approach to the sellar region for pituitary tumor resection. Recommend correlation with findings at the time of the procedure. IMPRESSION: Surgical instrument extending from anterior transsphenoidal approach with tip over the sellar region for presumed pituitary tumor resection. Electronically Signed   By: Marin Olp M.D.   On:  10/11/2015 12:50    Assessment/Plan: We'll begin clear liquids, and by mouth analgesics, to see if they will last longer and be more effective for her. We'll begin every hour I's and O's and specific gravities every 4 hours and when necessary significantly increased urine output.  Stable following surgery earlier today. Have spoken with the patient's sisters.   Hosie Spangle, MD 10/11/2015, 3:42 PM'

## 2015-10-11 NOTE — Transfer of Care (Signed)
Immediate Anesthesia Transfer of Care Note  Patient: Cindy Ellis  Procedure(s) Performed: Procedure(s) with comments: Transphenoidal resection of pituitary tumor (N/A) - Transphenoidal resection of pituitary tumor TRANSNASAL APPROACH (N/A)  Patient Location: PACU  Anesthesia Type:General  Level of Consciousness: sedated  Airway & Oxygen Therapy: Patient Spontanous Breathing and Patient connected to face mask oxygen  Post-op Assessment: Report given to RN and Post -op Vital signs reviewed and stable  Post vital signs: Reviewed and stable  Last Vitals:  Filed Vitals:   10/11/15 0706  BP: 121/55  Pulse: 70  Temp: 36.4 C  Resp: 20    Complications: No apparent anesthesia complications

## 2015-10-12 ENCOUNTER — Encounter (HOSPITAL_COMMUNITY): Payer: Self-pay | Admitting: Neurosurgery

## 2015-10-12 LAB — CBC WITH DIFFERENTIAL/PLATELET
Basophils Absolute: 0 10*3/uL (ref 0.0–0.1)
Basophils Relative: 0 %
Eosinophils Absolute: 0 10*3/uL (ref 0.0–0.7)
Eosinophils Relative: 0 %
HCT: 43.2 % (ref 36.0–46.0)
Hemoglobin: 14 g/dL (ref 12.0–15.0)
Lymphocytes Relative: 10 %
Lymphs Abs: 1.3 10*3/uL (ref 0.7–4.0)
MCH: 29.2 pg (ref 26.0–34.0)
MCHC: 32.4 g/dL (ref 30.0–36.0)
MCV: 90.2 fL (ref 78.0–100.0)
Monocytes Absolute: 0.7 10*3/uL (ref 0.1–1.0)
Monocytes Relative: 5 %
Neutro Abs: 11 10*3/uL — ABNORMAL HIGH (ref 1.7–7.7)
Neutrophils Relative %: 85 %
Platelets: 253 10*3/uL (ref 150–400)
RBC: 4.79 MIL/uL (ref 3.87–5.11)
RDW: 13.1 % (ref 11.5–15.5)
WBC: 13 10*3/uL — ABNORMAL HIGH (ref 4.0–10.5)

## 2015-10-12 LAB — BASIC METABOLIC PANEL
Anion gap: 8 (ref 5–15)
BUN: 15 mg/dL (ref 6–20)
CO2: 24 mmol/L (ref 22–32)
Calcium: 9.7 mg/dL (ref 8.9–10.3)
Chloride: 104 mmol/L (ref 101–111)
Creatinine, Ser: 0.79 mg/dL (ref 0.44–1.00)
GFR calc Af Amer: >60 mL/min (ref >60)
GFR calc non Af Amer: >60 mL/min (ref >60)
Glucose, Bld: 149 mg/dL — ABNORMAL HIGH (ref 65–99)
Potassium: 4 mmol/L (ref 3.5–5.1)
Sodium: 136 mmol/L (ref 135–145)

## 2015-10-12 MED ORDER — OXYCODONE HCL 5 MG PO TABS
10.0000 mg | ORAL_TABLET | Freq: Once | ORAL | Status: AC
  Administered 2015-10-12: 10 mg via ORAL
  Filled 2015-10-12: qty 2

## 2015-10-12 MED ORDER — PANTOPRAZOLE SODIUM 40 MG PO TBEC
40.0000 mg | DELAYED_RELEASE_TABLET | Freq: Every day | ORAL | Status: DC
  Administered 2015-10-13 (×2): 40 mg via ORAL
  Filled 2015-10-12 (×2): qty 1

## 2015-10-12 NOTE — Progress Notes (Signed)
Subjective: Patient much more comfortable. Headache has resolved. Urine output stable. Specific gravities stable. No evidence of diabetes insipidus. Labs this morning look good.  Objective: Vital signs in last 24 hours: Filed Vitals:   10/12/15 0600 10/12/15 0630 10/12/15 0700 10/12/15 0747  BP: 132/41 121/61 123/63   Pulse: 89 55 52   Temp:    97.4 F (36.3 C)  TempSrc:    Oral  Resp: 25 13 11    Height:      Weight:      SpO2: 96% 94% 95%     Intake/Output from previous day: 12/07 0701 - 12/08 0700 In: 3876.7 [P.O.:1710; I.V.:2166.7] Out: 2345 [Urine:2270; Blood:75] Intake/Output this shift:    Physical Exam:  Much more alert this morning.  Following commands. Moving all extremity is well.  CBC  Recent Labs  10/12/15 0440  WBC 13.0*  HGB 14.0  HCT 43.2  PLT 253   BMET  Recent Labs  10/12/15 0440  NA 136  K 4.0  CL 104  CO2 24  GLUCOSE 149*  BUN 15  CREATININE 0.79  CALCIUM 9.7    Assessment/Plan: Continue to closely monitor I's and O's, we'll leave Foley in at least until the morning. Will DC a line, and begin to mobilize. We'll have her out of bed to chair this morning, and progress to ambulation in the ICU. We will advance her diet to a soft diet.  We'll change vital signs neuro checks to every 2 hours.   Hosie Spangle, MD 10/12/2015, 7:50 AM

## 2015-10-12 NOTE — Progress Notes (Signed)
Made Dr. Sherwood Gambler aware of patient's urine output increasing, urine clearing up, and specific gravity 1.005. No further orders received, will continue to monitor patient.

## 2015-10-12 NOTE — Care Management Note (Signed)
Case Management Note  Patient Details  Name: Rosebud Sharpless MRN: [id number] Date of Birth: 06-18-1956  Subjective/Objective:    Pt admitted on 10/11/15 s/p transphenoidal resection of pituitary tumor.  PTA, pt independent of ADLS.                  Action/Plan: Will follow for discharge planning as pt progresses.    Expected Discharge Date:                  Expected Discharge Plan:  Home/Self Care  In-House Referral:     Discharge planning Services  CM Consult  Post Acute Care Choice:    Choice offered to:     DME Arranged:    DME Agency:     HH Arranged:    HH Agency:     Status of Service:  In process, will continue to follow  Medicare Important Message Given:    Date Medicare IM Given:    Medicare IM give by:    Date Additional Medicare IM Given:    Additional Medicare Important Message give by:     If discussed at [place] of Stay Meetings, dates discussed:    Additional Comments:  Reinaldo Raddle, RN, BSN  Trauma/Neuro ICU Case Manager [phone]

## 2015-10-12 NOTE — Progress Notes (Signed)
   ENT Progress Note: POD #1  s/p Procedure(s): Transphenoidal resection of pituitary tumor TRANSNASAL APPROACH   Subjective: C/O headache  Objective: Vital signs in last 24 hours: Temp:  [96.8 F (36 C)-98.4 F (36.9 C)] 97.4 F (36.3 C) (12/08 0747) Pulse Rate:  [48-89] 60 (12/08 1100) Resp:  [11-27] 14 (12/08 1100) BP: (116-166)/(41-83) 143/55 mmHg (12/08 1100) SpO2:  [94 %-100 %] 100 % (12/08 1100) Arterial Line BP: (74-194)/(65-83) 147/74 mmHg (12/08 0800) FiO2 (%):  [35 %] 35 % (12/07 1538) Weight:  [127.6 kg (281 lb 4.9 oz)] 127.6 kg (281 lb 4.9 oz) (12/07 1315) Weight change:     Intake/Output from previous day: 12/07 0701 - 12/08 0700 In: 3876.7 [P.O.:1710; I.V.:2166.7] Out: 2345 [Urine:2270; Blood:75] Intake/Output this shift: Total I/O In: 250 [I.V.:200; IV Piggyback:50] Out: [id number] [Urine:1475]  Labs:  Recent Labs  10/12/15 0440  WBC 13.0*  HGB 14.0  HCT 43.2  PLT 253    Recent Labs  10/12/15 0440  NA 136  K 4.0  CL 104  CO2 24  GLUCOSE 149*  BUN 15  CALCIUM 9.7    Studies/Results: Dg Skull 1-3 Views  10/11/2015  CLINICAL DATA:  Trans-sphenoidal pituitary tumor resection. EXAM: DG C-ARM 61-120 MIN; SKULL - 1-3 VIEW COMPARISON:  MRI 08/16/2015 FINDINGS: Lateral intraoperative of skull images demonstrate surgical instrument extending from anterior transsphenoidal approach to the sellar region for pituitary tumor resection. Recommend correlation with findings at the time of the procedure. IMPRESSION: Surgical instrument extending from anterior transsphenoidal approach with tip over the sellar region for presumed pituitary tumor resection. Electronically Signed   By: Marin Olp M.D.   On: 10/11/2015 12:50   Dg C-arm 1-60 Min  10/11/2015  CLINICAL DATA:  Trans-sphenoidal pituitary tumor resection. EXAM: DG C-ARM 61-120 MIN; SKULL - 1-3 VIEW COMPARISON:  MRI 08/16/2015 FINDINGS: Lateral intraoperative of skull images demonstrate surgical  instrument extending from anterior transsphenoidal approach to the sellar region for pituitary tumor resection. Recommend correlation with findings at the time of the procedure. IMPRESSION: Surgical instrument extending from anterior transsphenoidal approach with tip over the sellar region for presumed pituitary tumor resection. Electronically Signed   By: Marin Olp M.D.   On: 10/11/2015 12:50     PHYSICAL EXAM: Sutures and packing in place Min bloody d/c EOMI, A&O   Assessment/Plan: Pt stable Cont current tx Plan packing removal POD #5    Vander Kueker 10/12/2015, 12:25 PM

## 2015-10-12 NOTE — Progress Notes (Signed)
Subjective: Patient resting comfortably in bed, set up in a chair for good while this morning, but eventually developed headache. Transferred back to bed. Urine output increased earlier this afternoon, but patient given her usual Maxzide this morning, and increased urine output may be due to that. Specific gravities adequate.  Objective: Vital signs in last 24 hours: Filed Vitals:   10/12/15 1100 10/12/15 1200 10/12/15 1300 10/12/15 1400  BP: 143/55 155/69 148/74 154/72  Pulse: 60 55 52 52  Temp:      TempSrc:      Resp: 14 16 14 13   Height:      Weight:      SpO2: 100% 97% 96% 99%    Intake/Output from previous day: 12/07 0701 - 12/08 0700 In: 3876.7 [P.O.:1710; I.V.:2166.7] Out: 2345 [Urine:2270; Blood:75] Intake/Output this shift: Total I/O In: 400 [I.V.:350; IV Piggyback:50] Out: 2225 [Urine:2225]  Physical Exam:  Awake alert, oriented. Following commands. Moving all 4 extremity is well.  Assessment/Plan: We'll continue to taper hydrocortisone drip, decreasing is a 25 mL/h at 2000. We'll check BMET in a.m.   Hosie Spangle, MD 10/12/2015, 2:24 PM

## 2015-10-12 NOTE — Op Note (Signed)
NAMEAMYMARIE, GAL NO.:  [id number]  MEDICAL RECORD NO.:  [id number]  LOCATION:  3M07C                        FACILITY:  [facility]  PHYSICIAN:  Early Chars. Wilburn Cornelia, M.D.DATE OF BIRTH:  [birth year]  DATE OF PROCEDURE:  10/11/2015 DATE OF DISCHARGE:                              OPERATIVE REPORT   LOCATION:  [facility] Neurosurgical OR.  PREOPERATIVE DIAGNOSIS:  Pituitary tumor.  POSTOPERATIVE DIAGNOSIS:  Pituitary tumor.  INDICATION FOR SURGERY:  Pituitary tumor.  PROCEDURE:  Revision transseptal transsphenoidal pituitary resection.  NEUROSURGEON:  Hosie Spangle, M.D.  ENT SURGEON:  Early Chars. Wilburn Cornelia, M.D.  ANESTHESIA:  General endotracheal.  COMPLICATIONS:  None.  ESTIMATED BLOOD LOSS:  Less than 50 mL.  DISPOSITION:  The patient was transferred from the operating room to the recovery room in stable condition.  BRIEF HISTORY:  The patient is a 59 year old black female, who has been followed with a history of visual change and headache.  Initial workup including imaging study showed a large pituitary mass consistent with pituitary adenoma, and the patient underwent transseptal transsphenoidal resection performed approximately 61 months ago.  The patient did well after her surgery, no significant hormonal abnormalities and her vision returned to near normal.  Followup MRI scans performed through Dr. Donnella Bi office showed a small amount of residual tumor as expected. The area of the tumor growth was abutting the optic chiasm and left cavernous sinus, concerns raised regarding slow of enlargement in this area consistent with residual pituitary tumor.  Given the patient's history and findings, the risks and benefits of revision, transseptal transsphenoidal pituitary resection were discussed in detail. Specifically, the ENT-related potential complications and consequences of revision surgery.  The patient understood and agree with the plan  for surgery which is scheduled on elective basis on October 11, 2015, at the [facility] Neurosurgical OR.  DESCRIPTION OF PROCEDURE:  The patient was brought to the operating room, placed in supine position on the operating table.  General endotracheal anesthesia was established without difficulty.  The patient was positioned, prepped and draped.  A surgical time-out was then performed with correct identification of the patient and the surgical procedure.  The patient's nose was then injected with a total of 6 mL of 1% lidocaine 1:100,000 solution of epinephrine injected in submucosal fashion along the nasal septum and lateral nasal wall as well as the anterior nasal columella.  The patient's nose was packed with Afrin- soaked cottonoid pledgets which were left in place for approximately 10 minutes to allow for vasoconstriction and hemostasis.  The Xomed Fusion head gear was then applied and anatomic and surgical landmarks were identified and confirmed.  The navigation device was used throughout the approach and pituitary resection.  A cross-table C-arm x-ray was also positioned to allow better visualization of the pituitary fossa and anterior and posterior sphenoid wall.  With the patient positioned, prepped and draped, surgical procedure begun by creating a left anterior hemitransfixion incision, this was carried through the mucosa and underlying submucosa, there was a moderate amount of scar tissue from her previous surgery.  The septal cartilage which had been previously returned to the mucoperichondrial pocket was identified, and this  was used as a surgical dissection plane from anterior to posterior along the nasal septum.  Bilateral septal flaps were then elevated without disruption of the mucosa.  The flaps were intact from anterior to posterior.  The anterior face of the sphenoid sinus was then carefully palpated and confirmed using the Xomed Fusion device.  Incision  was then extended along the left floor of the nose as well as along the anterior columellar skin and the scar from her previous surgery.  The columella was divided and then reflected to the right creating a widely patent access to the nasal septum.  The [place] retractor was then inserted and expanded giving excellent visualization of the anterior aspect of the sphenoid sinus.  There was some mucoid material which was suctioned.  The anterior face of the pituitary fossa was then carefully palpated, and this was confirmed with a cross-table x- ray as well as the fusion navigation device.  With the approach completed, Dr. Sherwood Gambler then began the neurosurgical resection of pituitary tumor which is dictated as a separate operative report.  When the pituitary tumor had been appropriately removed and reconstruction of the pituitary fossa completed, reconstruction of the nasal septum was undertaken.  With conclusion of the neurosurgical component of the procedure, reconstruction of nasal septum was undertaken beginning with reapproximation of the anterior septal columella using several interrupted 4-0 PDS sutures.  The previously resected septal cartilage was replaced in the mucoperichondrial pocket, and the flaps were reapproximated with interrupted 4-0 Vicryl suture.  The anterior hemitransfixion incision was closed with the same stitch along the anterior columella and septal closure interrupted 4-0 chromic was used in the mucosa and interrupted 6-0 Ethilon was used along the final skin edge.  The patient's nasal cavity was irrigated and suctioned. Bilateral Doyle nasal septal splints were placed after the application of Bactroban ointment and sutured in position with a 3-0 Ethilon suture. An 8 cm Merocel sinus sponges were then placed in each nasal cavity after the application of Bactroban ointment and hydrated with sterile saline.  The patient's oral cavity and oropharynx were  suctioned, irrigated and orogastric tube was passed.  Stomach contents were aspirated.  The patient was then awakened from her anesthetic, she was extubated and was transferred from the operating room to the neurosurgical recovery room in stable condition.  There were no complications.  Estimated blood loss was approximately 50 mL.          ______________________________ Early Chars. Wilburn Cornelia, M.D.     DLS/MEDQ  D:  [id number]  T:  10/12/2015  Job:  [id number]

## 2015-10-13 LAB — BASIC METABOLIC PANEL
Anion gap: 8 (ref 5–15)
BUN: 16 mg/dL (ref 6–20)
CO2: 25 mmol/L (ref 22–32)
Calcium: 9.6 mg/dL (ref 8.9–10.3)
Chloride: 106 mmol/L (ref 101–111)
Creatinine, Ser: 0.9 mg/dL (ref 0.44–1.00)
GFR calc Af Amer: >60 mL/min (ref >60)
GFR calc non Af Amer: >60 mL/min (ref >60)
Glucose, Bld: 111 mg/dL — ABNORMAL HIGH (ref 65–99)
Potassium: 4.4 mmol/L (ref 3.5–5.1)
Sodium: 139 mmol/L (ref 135–145)

## 2015-10-13 NOTE — Progress Notes (Signed)
Subjective: Patient resting comfortably in bed. Did ambulate twice yesterday including one longer walk around the ICU. Urine output and specific gravities remained stable. BMET looks good this morning. Continuing to taper hydrocortisone drip.  Objective: Vital signs in last 24 hours: Filed Vitals:   10/13/15 0500 10/13/15 0600 10/13/15 0700 10/13/15 0755  BP: 120/53 127/68 131/70   Pulse: 50 45 48   Temp:    98.2 F (36.8 C)  TempSrc:    Axillary  Resp: 17 15 12    Height:      Weight:      SpO2: 96% 94% 98%     Intake/Output from previous day: 12/08 0701 - 12/09 0700 In: 2435 [P.O.:1460; I.V.:925; IV Piggyback:50] Out: [id number] [Urine:6145] Intake/Output this shift:    Physical Exam:  Awake alert, oriented. Following commands. Moving all 4 extremities well.  CBC  Recent Labs  10/12/15 0440  WBC 13.0*  HGB 14.0  HCT 43.2  PLT 253   BMET  Recent Labs  10/12/15 0440 10/13/15 0532  NA 136 139  K 4.0 4.4  CL 104 106  CO2 24 25  GLUCOSE 149* 111*  BUN 15 16  CREATININE 0.79 0.90  CALCIUM 9.7 9.6    Assessment/Plan: Continued to progress through postoperative recovery. We will decrease hydrocortisone drip to 10 mL per hour now, and DC at 1400. We'll DC Foley. Encouraged to ambulate at least 4 times today in the ICU with the staff.   Hosie Spangle, MD 10/13/2015, 8:04 AM

## 2015-10-14 MED ORDER — HYDROCODONE-ACETAMINOPHEN 5-325 MG PO TABS: 1.0000 | ORAL_TABLET | ORAL | Status: DC | PRN

## 2015-10-14 MED ORDER — CEPHALEXIN 500 MG PO CAPS: 500.0000 mg | ORAL_CAPSULE | Freq: Three times a day (TID) | ORAL | Status: DC

## 2015-10-14 NOTE — Discharge Summary (Signed)
Physician Discharge Summary  Patient ID: Cindy Ellis MRN: [id number] DOB/AGE: December 22, 1955 59 y.o.  Admit date: 10/11/2015 Discharge date: 10/14/2015  Admission Diagnoses:  Recurrent pituitary tumor  Discharge Diagnoses:  Recurrent pituitary tumor Active Problems:   Pituitary adenoma with extrasellar extension [facility])   Discharged Condition: good  Hospital Course: Patient admitted, underwent a transseptal, transsphenoidal resection of recurrent pituitary adenoma. Patient has done well following surgery. Her urine output and specific gravities have been stable, and she's had no evidence of diabetes insipidus. She is up and ambulating actively in the halls. Foley was DC'd, and she is voiding well. She is scheduled to return in 2 days to Dr. Jerrell Belfast for removal of her nasal packings. I've asked her to see her endocrinologist, Dr. Delrae Rend, for pituitary endocrine recheck within the next 2 weeks, and she is already scheduled to follow-up with me in about 3 weeks. She has been given instructions regarding activities following discharge.  Discharge Exam: Blood pressure 160/89, pulse 46, temperature 97.5 F (36.4 C), temperature source Axillary, resp. rate 11, height 5\' 5"  (1.651 m), weight 127.6 kg (281 lb 4.9 oz), SpO2 94 %.  Disposition: 01-Home or Self Care     Medication List    STOP taking these medications        calcium-vitamin D 250-125 MG-UNIT tablet  Commonly known as:  OSCAL      TAKE these medications        amLODipine 5 MG tablet  Commonly known as:  NORVASC  Take 5 mg by mouth daily.     atenolol 25 MG tablet  Commonly known as:  TENORMIN  Take 25 mg by mouth daily.     CALCIUM 600/VITAMIN D3 600-800 MG-UNIT Tabs  Generic drug:  Calcium Carb-Cholecalciferol  Take 2 capsules by mouth daily.     cephALEXin 500 MG capsule  Commonly known as:  KEFLEX  Take 1 capsule (500 mg total) by mouth 3 (three) times daily.     ferrous sulfate 325 (65 FE) MG tablet   Take 325 mg by mouth daily with breakfast.     Garlic 123XX123 MG Caps  Take 1,000 mg by mouth daily.     HYDROcodone-acetaminophen 5-325 MG tablet  Commonly known as:  NORCO  Take 1-2 tablets by mouth every 6 (six) hours as needed.     HYDROcodone-acetaminophen 5-325 MG tablet  Commonly known as:  NORCO/VICODIN  Take 1-2 tablets by mouth every 4 (four) hours as needed for moderate pain.     levothyroxine 175 MCG tablet  Commonly known as:  SYNTHROID, LEVOTHROID  Take 175 mcg by mouth daily before breakfast.     lisinopril 40 MG tablet  Commonly known as:  PRINIVIL,ZESTRIL  Take 40 mg by mouth daily.     MAXZIDE-25 37.5-25 MG tablet  Generic drug:  triamterene-hydrochlorothiazide  Take 1 tablet by mouth daily.     Omega 3 1000 MG Caps  Take 1,000 mg by mouth daily.     rosuvastatin 20 MG tablet  Commonly known as:  CRESTOR  Take 20 mg by mouth daily.     SM MULTIPLE VITAMINS/IRON Tabs  Take 1 tablet by mouth daily.     vitamin B-12 1000 MCG tablet  Commonly known as:  CYANOCOBALAMIN  Take 1,000 mcg by mouth daily.     Vitamin D3 2000 UNITS capsule  Take 2,000 Units by mouth daily.           Follow-up Information    Follow up  with SHOEMAKER, DAVID, MD. Go in 2 days.   Specialty:  Otolaryngology   Why:  nasal packing removal   Contact information:   [address] [place] [address] [phone]       Follow up with KERR,JEFFREY, MD. Call in 2 days.   Specialty:  Endocrinology   Why:  for appointment for pituitary endocrine recheck before Christmas   Contact information:   [address] [place] [address] [phone]       Follow up with Hosie Spangle, MD. Go in 3 weeks.   Specialty:  Neurosurgery   Why:  for neurosurgicl follow-up   Contact information:   [address] [place] [address] [phone]       Signed: Hosie Spangle 10/14/2015, 8:25 AM

## 2015-12-01 ENCOUNTER — Other Ambulatory Visit: Payer: Self-pay | Admitting: Neurosurgery

## 2015-12-01 DIAGNOSIS — D497 Neoplasm of unspecified behavior of endocrine glands and other parts of nervous system: Secondary | ICD-10-CM

## 2015-12-11 ENCOUNTER — Ambulatory Visit
Admission: RE | Admit: 2015-12-11 | Discharge: 2015-12-11 | Disposition: A | Discharge status: Home / Self Care | Payer: BC Managed Care – PPO | Source: Ambulatory Visit | Attending: Neurosurgery | Admitting: Neurosurgery

## 2015-12-11 DIAGNOSIS — D497 Neoplasm of unspecified behavior of endocrine glands and other parts of nervous system: Secondary | ICD-10-CM

## 2015-12-11 MED ORDER — GADOBENATE DIMEGLUMINE 529 MG/ML IV SOLN
10.0000 mL | Freq: Once | INTRAVENOUS | Status: AC | PRN
  Administered 2015-12-11: 10 mL via INTRAVENOUS

## 2015-12-25 ENCOUNTER — Ambulatory Visit
Admission: RE | Admit: 2015-12-25 | Discharge: 2015-12-25 | Disposition: A | Discharge status: Home / Self Care | Payer: BC Managed Care – PPO | Source: Ambulatory Visit | Attending: Radiation Oncology | Admitting: Radiation Oncology

## 2015-12-25 ENCOUNTER — Encounter: Payer: Self-pay | Admitting: Radiation Oncology

## 2015-12-25 VITALS — BP 160/70 | HR 75 | Temp 98.0°F | Resp 16 | Ht 65.0 in | Wt 285.6 lb

## 2015-12-25 DIAGNOSIS — E785 Hyperlipidemia, unspecified: Secondary | ICD-10-CM | POA: Insufficient documentation

## 2015-12-25 DIAGNOSIS — D509 Iron deficiency anemia, unspecified: Secondary | ICD-10-CM | POA: Diagnosis not present

## 2015-12-25 DIAGNOSIS — Z510 Encounter for antineoplastic radiation therapy: Secondary | ICD-10-CM | POA: Insufficient documentation

## 2015-12-25 DIAGNOSIS — I10 Essential (primary) hypertension: Secondary | ICD-10-CM | POA: Insufficient documentation

## 2015-12-25 DIAGNOSIS — D352 Benign neoplasm of pituitary gland: Secondary | ICD-10-CM

## 2015-12-25 DIAGNOSIS — Z85850 Personal history of malignant neoplasm of thyroid: Secondary | ICD-10-CM | POA: Insufficient documentation

## 2015-12-25 DIAGNOSIS — E890 Postprocedural hypothyroidism: Secondary | ICD-10-CM | POA: Diagnosis not present

## 2015-12-25 DIAGNOSIS — Z86010 Personal history of colonic polyps: Secondary | ICD-10-CM | POA: Insufficient documentation

## 2015-12-25 NOTE — Progress Notes (Signed)
See progress note under physician encounter. 

## 2015-12-25 NOTE — Progress Notes (Signed)
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   "Had cataract sx, couldn't see after, Dr. Modena Nunnery referred her to [facility]. MRI done. First pituitary sx done 04/2014. Seen by endocrine doc who found thyroid tumor. Thyroid tumor removed followed by radioactive iodine. [place] did one year MRI. Second pituitary surgery done 10/11/15."  Past or anticipated interventions, if any, per neurosurgery: 10/11/15 revision transseptal transsphenoidal pituitary resection. 04/25/14 craniotomy hypophysectomy transnasal approach.  Past or anticipated interventions, if any, per medical oncology: no  Dose of Decadron, if applicable: No  Recent neurologic symptoms, if any:   Seizures: no  Headaches: no  Nausea: no  Dizziness/ataxia: no  Difficulty with hand coordination: no  Focal numbness/weakness: no  Visual deficits/changes: poor vision but, understands she shouldn't have her prescription updated until 3 months s/p srs. Reports seeing colorless starburst at night.   Confusion/Memory deficits: no  Painful bone metastases at present, if any: no  SAFETY ISSUES:  Prior radiation? Yes; radioactive iodine  Pacemaker/ICD? no  Possible current pregnancy? no  Is the patient on methotrexate? no  Additional Complaints / other details: 60 year old female. Hx of thyroid cancer. NKDA.   Simulation scheduled for 2/24. Initial tx scheduled for 3/6 at 3 pm.

## 2015-12-26 NOTE — Patient Instructions (Signed)
Contact our office if you have any questions following today's appointment: [phone].  

## 2015-12-26 NOTE — Progress Notes (Signed)
Radiation Oncology         [phone]) [phone] ________________________________  Initial outpatient Consultation  Name: Cindy Ellis MRN: [id number]  Date: 12/25/2015  DOB: 18-May-1956  [id number] D, MD  Jovita Gamma, MD   REFERRING PHYSICIAN: Jovita Gamma, MD  DIAGNOSIS: The encounter diagnosis was Pituitary adenoma with extrasellar extension [facility]).    ICD-9-CM ICD-10-CM   1. Pituitary adenoma with extrasellar extension (HCC) 227.3 D35.2 BUN     Creatinine, serum    HISTORY OF PRESENT ILLNESS: Cindy Ellis is a 60 y.o. female seen at the request of Dr. Sherwood Gambler for recurrent pituitary adenoma. Patient was originally diagnosed in June 2015 with a pituitary adenoma. The patient had had bilateral cataract extraction and was not noting significant improvement in her visual acuity. Ultimately an MRI of the brain on 03/22/2014 revealed a 2.3 x 2.1 x 3.4 cm centrally necrotic pituitary macroadenoma. She subsequently underwent transsphenoidal resection of this mass on 04/25/2014. Final pathology was consistent with pituitary adenoma. She has been followed closely with Dr. Sherwood Gambler, however began to experience changes in her vision in approximately November 2016. She was taken back to the operating room on 10/11/2015 where she underwent a second transsphenoidal resection and 1.1 cm in aggregate was submitted consistent with persistent pituitary adenoma. Postsurgical MRI was repeated on 12/11/2015 revealing oval resection of recurrent pituitary adenoma however more normal enhancing pituitary tissue is evident on the right, slight drooping of the optic chiasm was seen since prior exam. With the concern of persistent disease, she is seen at the request of Dr. Sherwood Gambler today for consideration of SRS under the care of Dr. Tammi Klippel.   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  PAST MEDICAL HISTORY:  Past Medical History  Diagnosis Date    . Hypertension     takes Lisinopril,Atenolol,Amlodipine,and Triam-HCTZ daily  . Hyperlipidemia     takes Atorvastatin daily  . Pituitary tumor ([place])   . History of colon polyps   . History of blood transfusion     "related to hysterectomy/fibroids"  . Neoplasm of thyroid   . Anemia     hx of;when she was younger  . Iron deficiency anemia   . Cancer [facility])     thyroid cancer  . Wears glasses       PAST SURGICAL HISTORY: Past Surgical History  Procedure Laterality Date  . Tubal ligation  1980  . Abdominal hysterectomy  1999  . Carpal tunnel release Bilateral 2014  . Cataract extraction w/ intraocular lens  implant, bilateral Bilateral 2015  . Colonoscopy    . Craniotomy N/A 04/25/2014    Procedure: CRANIOTOMY HYPOPHYSECTOMY TRANSNASAL APPROACH;  Surgeon: Hosie Spangle, MD;  Location: [place] [facility]UR[facility] OR[facility];  Service: Neurosurgery;  Laterality: N/A;  Transphenoidal resection of pituitary tumor Dr Wilburn Cornelia to approach  . Transnasal approach N/A 04/25/2014    Procedure: TRANSNASAL APPROACH;  Surgeon: Jerrell Belfast, MD;  Location: [facility];  Service: ENT;  Laterality: N/A;  TRANSNASAL APPROACH  . Thyroidectomy, partial Right 10/12/2014    hemi  . Thyroidectomy Right 10/12/2014    Procedure: RIGHT HEMI-THYROIDECTOMY WITH NIMS MONITOR;  Surgeon: Jerrell Belfast, MD;  Location: [place];  Service: ENT;  Laterality: Right;  . Thyroidectomy, partial Left 12/23/2014    DR SHOEMAKER  . Thyroidectomy Left 12/23/2014    Procedure: THYROIDECTOMY LEFT SIDE;  Surgeon: Jerrell Belfast, MD;  Location: [place];  Service: ENT;  Laterality: Left;  Completion thyroidectomy   . Craniotomy  N/A 10/11/2015    Procedure: Transphenoidal resection of pituitary tumor;  Surgeon: Jovita Gamma, MD;  Location: [place] NEURO ORS;  Service: Neurosurgery;  Laterality: N/A;  Transphenoidal resection of pituitary tumor  . Transnasal approach N/A 10/11/2015    Procedure: TRANSNASAL APPROACH;  Surgeon: Jerrell Belfast, MD;   Location: [facility];  Service: ENT;  Laterality: N/A;    FAMILY HISTORY:  Family History  Problem Relation Age of Onset  . Hypertension Other     SOCIAL HISTORY:  Social History   Social History  . Marital Status: Divorced    Spouse Name: N/A  . Number of Children: N/A  . Years of Education: N/A   Occupational History  . Not on file.   Social History Main Topics  . Smoking status: Never Smoker   . Smokeless tobacco: Never Used  . Alcohol Use: No  . Drug Use: No  . Sexual Activity: Not Currently   Other Topics Concern  . Not on file   Social History Narrative    ALLERGIES: Review of patient's allergies indicates no known allergies.  MEDICATIONS:  Current Outpatient Prescriptions  Medication Sig Dispense Refill  . amLODipine (NORVASC) 5 MG tablet Take 5 mg by mouth.    Marland Kitchen atenolol (TENORMIN) 25 MG tablet Take 25 mg by mouth.    . Calcium Carb-Cholecalciferol (CALCIUM 600/VITAMIN D3) 600-800 MG-UNIT TABS Take 2 capsules by mouth daily.    . Cholecalciferol (VITAMIN D3) 2000 UNITS capsule Take 2,000 Units by mouth daily.    . ferrous sulfate 325 (65 FE) MG tablet Take 325 mg by mouth daily with breakfast.    . Garlic 123XX123 MG CAPS Take 1,000 mg by mouth daily.    Marland Kitchen levothyroxine (SYNTHROID, LEVOTHROID) 175 MCG tablet Take 150 mcg by mouth daily before breakfast.     . lisinopril (PRINIVIL,ZESTRIL) 40 MG tablet Take 40 mg by mouth daily.     . Omega 3 1000 MG CAPS Take 1,000 mg by mouth daily.    . rosuvastatin (CRESTOR) 20 MG tablet Take 20 mg by mouth daily.    Marland Kitchen SM MULTIPLE VITAMINS/IRON TABS Take 1 tablet by mouth daily.     Marland Kitchen triamterene-hydrochlorothiazide (MAXZIDE-25) 37.5-25 MG tablet Take by mouth.    . vitamin B-12 (CYANOCOBALAMIN) 1000 MCG tablet Take 1,000 mcg by mouth daily.    Marland Kitchen HYDROcodone-acetaminophen (NORCO/VICODIN) 5-325 MG tablet Take 1-2 tablets by mouth every 4 (four) hours as needed for moderate pain. (Patient not taking: Reported on 12/25/2015)  75 tablet 0   No current facility-administered medications for this encounter.    REVIEW OF SYSTEMS:  On review of systems the patient reports that overall she's been doing pretty well. She continues to see [place] when looking at circular light. She denies any flashing lights or double vision. She has not expensing blurry vision. She denies any movement of words when reading. She states that she does need to have her prescription change but understands that this should be postponed until after radiation therapy. She denies any chest pain, shortness of breath, urinary frequency, bowel dysfunction, unintended weight changes, fevers or chills. A complete review of systems is obtained and is otherwise negative.   PHYSICAL EXAM:  height is 5\' 5"  (1.651 m) and weight is 285 lb 9.6 oz (129.547 kg). Her oral temperature is 98 F (36.7 C). Her blood pressure is 160/70 and her pulse is 75. Her respiration is 16 and oxygen saturation is 100%.   Pain Scale 0/10  In general  this is a well-appearing intermittent female in no acute distress. She's alert and oriented 4 and appropriate throughout the examination. Cardiovascular exam reveals a regular rate and rhythm, no clicks rubs or murmurs are auscultated. Chest is clear to auscultation bilaterally. HEENT reveals she is normocephalic atraumatic, EOMs are intact. PERRLA. No evidence of papilledema is noted on ophthalmic evaluation bilaterally. No evidence of rhinorrhea or nasal drip is noted from her nares. No palpable adenopathy is noted of the supraclavicular, cervical or axillary regions. The abdomen is intact with active bowel sounds 4, and is soft nontender nondistended. Her skin is intact without evidence of excoriations or gross lesion. Lower extremities are negative for pretibial pitting edema.   KPS = 90  100 - Normal; no complaints; no evidence of disease. 90   - Able to carry on normal activity; minor signs or symptoms of disease. 80   - Normal  activity with effort; some signs or symptoms of disease. 62   - Cares for self; unable to carry on normal activity or to do active work. 60   - Requires occasional assistance, but is able to care for most of his personal needs. 50   - Requires considerable assistance and frequent medical care. 34   - Disabled; requires special care and assistance. 65   - Severely disabled; hospital admission is indicated although death not imminent. 21   - Very sick; hospital admission necessary; active supportive treatment necessary. 10   - Moribund; fatal processes progressing rapidly. 0     - Dead  Karnofsky DA, Abel[place], Craver LS and Burchenal [facility] [phone]) The use of the nitrogen mustards in the palliative treatment of carcinoma: with particular reference to bronchogenic carcinoma Cancer 1 634-56  LABORATORY DATA:  Lab Results  Component Value Date   WBC 13.0* 10/12/2015   HGB 14.0 10/12/2015   HCT 43.2 10/12/2015   MCV 90.2 10/12/2015   PLT 253 10/12/2015   Lab Results  Component Value Date   NA 139 10/13/2015   K 4.4 10/13/2015   CL 106 10/13/2015   CO2 25 10/13/2015   No results found for: ALT, AST, GGT, ALKPHOS, BILITOT   RADIOGRAPHY: Mr Jeri Cos Wo Contrast  12/11/2015  CLINICAL DATA:  Pituitary surgery for re- current pituitary adenoma 2 months ago. Creatinine was obtained on site at [facility] at [address] Results: Creatinine 0.7 mg/dL. EXAM: MRI HEAD WITHOUT AND WITH CONTRAST TECHNIQUE: Multiplanar, multiecho pulse sequences of the brain and surrounding structures were obtained without and with intravenous contrast. CONTRAST:  20mL MULTIHANCE GADOBENATE DIMEGLUMINE 529 MG/ML IV SOLN COMPARISON:  MRI brain 08/16/2015 and 03/22/2014. FINDINGS: The previously noted recurrent pituitary tumor has been resected. A fat graft is noted in the inferior left aspect of the sella. There is enhancing pituitary tissue along the right side of the sella. The pituitary stalk is slightly  deviated to the right. There is slight drooping of the optic chiasm since the prior exam. No definite residual tumor is evident. The cavernous sinus is intact bilaterally. The optic nerves and optic chiasm are otherwise unremarkable. Mild periventricular and subcortical T2 changes bilaterally are stable. No acute infarct or hemorrhage is present. There is no supratentorial or suprasellar mass. No significant extraaxial fluid collection is present. The left maxillary sinus is chronically opacified. The anterior left ethmoid air cells and hypoplastic left frontal sinus are also opacified. The mastoid air cells are clear. IMPRESSION: 1. Interval resection of recurrent pituitary adenoma which was predominantly on  the left side of the sella. 2. Fat graft now noted in the left side of the sella. 3. More normal enhancing pituitary tissue is evident on the right. 4. Normal MRI appearance of the brain otherwise. Electronically Signed   By: San Morelle M.D.   On: 12/11/2015 15:56      IMPRESSION: 60 year old female with a recurrent Pituitary adenoma   PLAN: Dr. Tammi Klippel discusses the rationale for proceeding with stereotactic radiosurgery, and discusses the risks, benefits and pre-treatment planning. He discusses with the patient that he will coordinate with Dr. Sherwood Gambler for her  treatment and we will plan her simulation in the very near future. She states agreement and understanding of this plan. At the end of the discussion, all questions were answered to her satisfaction.  The above documentation reflects my direct findings during this shared patient visit. Please see the separate note by Dr. Tammi Klippel on this date for the remainder of the patient's plan of care.  Carola Rhine, PAC

## 2015-12-29 ENCOUNTER — Ambulatory Visit
Admission: RE | Admit: 2015-12-29 | Discharge: 2015-12-29 | Disposition: A | Discharge status: Home / Self Care | Payer: BC Managed Care – PPO | Source: Ambulatory Visit | Attending: Radiation Oncology | Admitting: Radiation Oncology

## 2015-12-29 VITALS — BP 154/91 | HR 71 | Temp 97.7°F | Ht 65.0 in | Wt 288.1 lb

## 2015-12-29 DIAGNOSIS — D352 Benign neoplasm of pituitary gland: Secondary | ICD-10-CM

## 2015-12-29 DIAGNOSIS — Z510 Encounter for antineoplastic radiation therapy: Secondary | ICD-10-CM | POA: Diagnosis not present

## 2015-12-29 LAB — BUN AND CREATININE (CC13)
BUN: 18.1 mg/dL (ref 7.0–26.0)
Creatinine: 0.8 mg/dL (ref 0.6–1.1)
EGFR: 87 mL/min/{1.73_m2} — ABNORMAL LOW (ref >90)

## 2015-12-29 MED ORDER — SODIUM CHLORIDE 0.9% FLUSH
10.0000 mL | Freq: Once | INTRAVENOUS | Status: AC
  Administered 2015-12-29: 10 mL via INTRAVENOUS

## 2015-12-29 NOTE — Progress Notes (Signed)
Cindy Ellis is here for an IV start for CT SIM.  She denies having pain and allergies or problems with IV contrast dye.  She is not taking metformin.

## 2015-12-29 NOTE — Progress Notes (Signed)
  Radiation Oncology         [phone] ________________________________  Name: Cindy Ellis MRN: [id number]  Date: 12/29/2015  DOB: 27-Sep-1956  SIMULATION AND TREATMENT PLANNING NOTE    ICD-9-CM ICD-10-CM   1. Pituitary adenoma with extrasellar extension ([place]) 227.3 D35.2     DIAGNOSIS: 60 year old woman with a recurrent Pituitary adenoma   NARRATIVE:  The patient was brought to the [place].  Identity was confirmed.  All relevant records and images related to the planned course of therapy were reviewed.  The patient freely provided informed written consent to proceed with treatment after reviewing the details related to the planned course of therapy. The consent form was witnessed and verified by the simulation staff. Intravenous access was established for contrast administration. Then, the patient was set-up in a stable reproducible supine position for radiation therapy.  A relocatable thermoplastic stereotactic head frame was fabricated for precise immobilization.  CT images were obtained.  Surface markings were placed.  The CT images were loaded into the planning software and fused with the patient's targeting MRI scan.  Then the target and avoidance structures were contoured.  Treatment planning then occurred.  The radiation prescription was entered and confirmed.  I have requested 3D planning  I have requested a DVH of the following structures: Brain stem, brain, left eye, right eye, lenses, optic chiasm, target volumes, uninvolved brain, and normal tissue.    SPECIAL TREATMENT PROCEDURE:  The planned course of therapy using radiation constitutes a special treatment procedure. Special care is required in the management of this patient for the following reasons. This treatment constitutes a Special Treatment Procedure for the following reason: High dose per fraction requiring special monitoring for increased toxicities of treatment including daily imaging.  The special  nature of the planned course of radiotherapy will require increased physician supervision and oversight to ensure patient's safety with optimal treatment outcomes.  PLAN:  The patient will receive 25 Gy in 5 fractions.  ________________________________  Sheral Apley Tammi Klippel, M.D.  This document serves as a record of services personally performed by Tyler Pita, MD. It was created on his behalf by Arlyce Harman, a trained medical scribe. The creation of this record is based on the scribe's personal observations and the provider's statements to them. This document has been checked and approved by the attending provider.

## 2015-12-29 NOTE — Progress Notes (Signed)
Received patient in nursing following simulation. Patient alert and oriented with complaints or distress. Removed right AC IV. Catheter intact upon removal. Applied an occlusive dressing to the old IV site. Patient discharged home. Patient left clinic ambulatory with a steady gait.

## 2015-12-29 NOTE — Progress Notes (Signed)
0924 Failed IV attempted x 2. Roswell Miners, RN of medical oncology started right [facility] 22 gauge IV on her second attempt. Patient tolerated all attempts well. Escorted patient to CT for simulation.

## 2015-12-31 DIAGNOSIS — Z510 Encounter for antineoplastic radiation therapy: Secondary | ICD-10-CM | POA: Diagnosis not present

## 2016-01-05 DIAGNOSIS — Z510 Encounter for antineoplastic radiation therapy: Secondary | ICD-10-CM | POA: Diagnosis not present

## 2016-01-08 ENCOUNTER — Encounter: Payer: Self-pay | Admitting: Radiation Oncology

## 2016-01-08 ENCOUNTER — Ambulatory Visit
Admission: RE | Admit: 2016-01-08 | Discharge: 2016-01-08 | Disposition: A | Discharge status: Home / Self Care | Payer: BC Managed Care – PPO | Source: Ambulatory Visit | Attending: Radiation Oncology | Admitting: Radiation Oncology

## 2016-01-08 VITALS — BP 146/100 | HR 75 | Temp 97.8°F

## 2016-01-08 DIAGNOSIS — D352 Benign neoplasm of pituitary gland: Secondary | ICD-10-CM

## 2016-01-08 DIAGNOSIS — Z510 Encounter for antineoplastic radiation therapy: Secondary | ICD-10-CM | POA: Diagnosis not present

## 2016-01-08 NOTE — Op Note (Signed)
Stereotactic Radiosurgery Operative Note  Name: Kaylissa Conley MRN: [id number]  Date: 01/08/2016  DOB: [birth year]  Op Note  Pre Operative Diagnosis:  Recurrent pituitary tumor  Post Operative Diagnois:  Recurrent pituitary tumor  3D TREATMENT PLANNING AND DOSIMETRY:  The patient's radiation plan was reviewed and approved by myself (neurosurgery) and Dr. Ledon Snare (radiation oncology) prior to treatment.  It showed 3-dimensional radiation distributions overlaid onto the planning CT/MRI image set.  The [facility] for the target structures as well as the organs at risk were reviewed. The documentation of the 3D plan and dosimetry are filed in the radiation oncology EMR.  NARRATIVE:  Emiya Norcutt was brought to the TrueBeam stereotactic radiation treatment machine and placed supine on the CT couch. The head frame was applied, and the patient was set up for stereotactic radiosurgery.  I was present for the set-up and delivery.  SIMULATION VERIFICATION:  In the couch zero-angle position, the patient underwent Exactrac imaging using the Brainlab system with orthogonal KV images.  These were carefully aligned and repeated to confirm treatment position for each of the isocenters.  The Exactrac snap film verification was repeated at each couch angle.  SPECIAL TREATMENT PROCEDURE: Kristen Cardinal received the first of a series of 5 fractions of stereotactic radiosurgery to the following target: Pituitary tumor target was treated using 4 Rapid Arc VMAT Beams to a prescription dose of 5 Gy (out of a total dose of 25 Gy divided in five 5 Gy fractions).  ExacTrac registration was performed for each couch angle.  The 100 % isodose line was prescribed.  STEREOTACTIC TREATMENT MANAGEMENT:  Following delivery, the patient was transported to nursing in stable condition and monitored for possible acute effects.  Vital signs were recorded. The patient tolerated treatment without significant acute effects, and was discharged to  home in stable condition.    PLAN: Follow-up in one month.

## 2016-01-08 NOTE — Progress Notes (Signed)
Cindy Ellis had her [facility] treatment today. She denies any nausea, pain or dizziness at this time.   BP 146/100 mmHg  Pulse 75  Temp(Src) 97.8 F (36.6 C)  SpO2 99%

## 2016-01-08 NOTE — Progress Notes (Signed)
Patient discharged home with her daughter. Pleasant affect noted. Patient without complaints of headache, dizziness, nausea, diplopia or ringing in the ears. Patient left clinic ambulatory, smiling, and laughing. Understands to call [phone] with needs. 

## 2016-01-08 NOTE — Progress Notes (Signed)
  Radiation Oncology         [phone] ________________________________  Stereotactic Treatment Procedure Note  Name: Cindy Ellis  MRN: [id number]  Date: 01/08/2016  DOB: 1956/08/12  SPECIAL TREATMENT PROCEDURE    ICD-9-CM ICD-10-CM   1. Pituitary adenoma with extrasellar extension ([place]) 227.3 D35.2       FRACTION:  1/5    3D TREATMENT PLANNING AND DOSIMETRY:  The patient's radiation plan was reviewed and approved by neurosurgery and radiation oncology prior to treatment.  It showed 3-dimensional radiation distributions overlaid onto the planning CT/MRI image set.  The [facility] for the target structures as well as the organs at risk were reviewed. The documentation of the 3D plan and dosimetry are filed in the radiation oncology EMR.  NARRATIVE:  Cindy Ellis was brought to the TrueBeam stereotactic radiation treatment machine and placed supine on the CT couch. The head frame was applied, and the patient was set up for stereotactic radiosurgery.  Neurosurgery was present for the set-up and delivery  SIMULATION VERIFICATION:  In the couch zero-angle position, the patient underwent Exactrac imaging using the Brainlab system with orthogonal KV images.  These were carefully aligned and repeated to confirm treatment position for each of the isocenters.  The Exactrac snap film verification was repeated at each couch angle.  PROCEDURE: Cindy Ellis received stereotactic radiosurgery to the following targets: pituitary target was treated using 4 Rapid Arc VMAT Beams to a fraction of 5 Gy to be repeated until prescription dose of 25 Gy.  ExacTrac registration was performed for each couch angle.  The 100% isodose line was prescribed.  6 MV X-rays were delivered in the flattening filter free beam mode.  STEREOTACTIC TREATMENT MANAGEMENT:  Following delivery, the patient was transported to nursing in stable condition and monitored for possible acute effects.  Vital signs were recorded BP 146/100 mmHg   Pulse 75  Temp(Src) 97.8 F (36.6 C)  SpO2 99%. The patient tolerated treatment without significant acute effects, and was discharged to home in stable condition.    PLAN: Complete 5 fractions and follow-up in one month.  ________________________________  Sheral Apley. Tammi Klippel, M.D.    This document serves as a record of services personally performed by Tyler Pita, MD. It was created on his behalf by Lendon Collar, a trained medical scribe. The creation of this record is based on the scribe's personal observations and the provider's statements to them. This document has been checked and approved by the attending provider.

## 2016-01-10 ENCOUNTER — Encounter: Payer: Self-pay | Admitting: Radiation Oncology

## 2016-01-10 ENCOUNTER — Ambulatory Visit
Admission: RE | Admit: 2016-01-10 | Discharge: 2016-01-10 | Disposition: A | Discharge status: Home / Self Care | Payer: BC Managed Care – PPO | Source: Ambulatory Visit | Attending: Radiation Oncology | Admitting: Radiation Oncology

## 2016-01-10 VITALS — BP 151/60 | HR 66 | Temp 97.8°F | Resp 18

## 2016-01-10 DIAGNOSIS — D352 Benign neoplasm of pituitary gland: Secondary | ICD-10-CM

## 2016-01-10 DIAGNOSIS — Z510 Encounter for antineoplastic radiation therapy: Secondary | ICD-10-CM | POA: Diagnosis not present

## 2016-01-10 NOTE — Progress Notes (Signed)
Vitals obtained by Guadelupe Sabin, RN. Patient without complaints of headache, dizziness, nausea, diplopia or ringing in the ears. Reports new onset of a metallic taste in her mouth. Patient discharged home ambulatory with her daughter. Patient understands to avoid strenuous activity for the next 24 hours and call [phone] with needs.

## 2016-01-12 ENCOUNTER — Ambulatory Visit
Admission: RE | Admit: 2016-01-12 | Discharge: 2016-01-12 | Disposition: A | Discharge status: Home / Self Care | Payer: BC Managed Care – PPO | Source: Ambulatory Visit | Attending: Radiation Oncology | Admitting: Radiation Oncology

## 2016-01-12 VITALS — BP 127/61 | HR 58 | Temp 97.8°F | Resp 16

## 2016-01-12 DIAGNOSIS — Z510 Encounter for antineoplastic radiation therapy: Secondary | ICD-10-CM | POA: Diagnosis not present

## 2016-01-12 DIAGNOSIS — C73 Malignant neoplasm of thyroid gland: Secondary | ICD-10-CM

## 2016-01-12 DIAGNOSIS — D352 Benign neoplasm of pituitary gland: Secondary | ICD-10-CM

## 2016-01-12 NOTE — Progress Notes (Signed)
  Radiation Oncology         [phone] ________________________________  Stereotactic Treatment Procedure Note  Name: Cindy Ellis  MRN: [id number]  Date: 01/12/2016  DOB: [birth year]  SPECIAL TREATMENT PROCEDURE    ICD-9-CM ICD-10-CM   1. Thyroid cancer ([place]) 193 C73   2. Pituitary adenoma with extrasellar extension ([place]) 227.3 D35.2       FRACTION:  3/5    3D TREATMENT PLANNING AND DOSIMETRY:  The patient's radiation plan was reviewed and approved by neurosurgery and radiation oncology prior to treatment.  It showed 3-dimensional radiation distributions overlaid onto the planning CT/MRI image set.  The [facility] for the target structures as well as the organs at risk were reviewed. The documentation of the 3D plan and dosimetry are filed in the radiation oncology EMR.  NARRATIVE:  Cindy Ellis was brought to the TrueBeam stereotactic radiation treatment machine and placed supine on the CT couch. The head frame was applied, and the patient was set up for stereotactic radiosurgery.  Neurosurgery was present for the set-up and delivery  SIMULATION VERIFICATION:  In the couch zero-angle position, the patient underwent Exactrac imaging using the Brainlab system with orthogonal KV images.  These were carefully aligned and repeated to confirm treatment position for each of the isocenters.  The Exactrac snap film verification was repeated at each couch angle.  PROCEDURE: Cindy Ellis received stereotactic radiosurgery to the following targets: pituitary target was treated using 4 Rapid Arc VMAT Beams to a fraction of 5 Gy to be repeated until prescription dose of 25 Gy.  ExacTrac registration was performed for each couch angle.  The 100% isodose line was prescribed.  6 MV X-rays were delivered in the flattening filter free beam mode.  STEREOTACTIC TREATMENT MANAGEMENT:  Following delivery, the patient was transported to nursing in stable condition and monitored for possible acute effects.  Vital  signs were recorded BP 127/61 mmHg  Pulse 58  Temp(Src) 97.8 F (36.6 C) (Oral)  Resp 16  SpO2 100%. The patient tolerated treatment without significant acute effects, and was discharged to home in stable condition.    PLAN: Complete 5 fractions and follow-up in one month.  ________________________________  Sheral Apley. Tammi Klippel, M.D.   This document serves as a record of services personally performed by Tyler Pita, MD. It was created on his behalf by Arlyce Harman, a trained medical scribe. The creation of this record is based on the scribe's personal observations and the provider's statements to them. This document has been checked and approved by the attending provider.

## 2016-01-12 NOTE — Progress Notes (Signed)
Vitals WDL. Denies dizziness, nausea, diplopia or ringing in the ears. Reports a mild headache last night and this morning. Patient discharged home ambulatory with her daughter. Patient understands to avoid strenuous activity for the next 24 hours and call [phone] with needs.

## 2016-01-14 NOTE — Progress Notes (Signed)
  Radiation Oncology         (336) [phone] ________________________________  Stereotactic Treatment Procedure Note  Name: Cindy Ellis  MRN: [id number]  Date: 01/10/2016  DOB: 22-Jul-1956  SPECIAL TREATMENT PROCEDURE    ICD-9-CM ICD-10-CM   1. 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  It showed 3-dimensional radiation distributions overlaid onto the planning CT/MRI image set.  The [facility] for the target structures as well as the organs at risk were reviewed. The documentation of the 3D plan and dosimetry are filed in the radiation oncology EMR.  NARRATIVE:  Cindy Ellis was brought to the TrueBeam stereotactic radiation treatment machine and placed supine on the CT couch. The head frame was applied, and the patient was set up for stereotactic radiosurgery.  Neurosurgery was present for the set-up and delivery  SIMULATION VERIFICATION:  In the couch zero-angle position, the patient underwent Exactrac imaging using the Brainlab system with orthogonal KV images.  These were carefully aligned and repeated to confirm treatment position for each of the isocenters.  The Exactrac snap film verification was repeated at each couch angle.  PROCEDURE: Cindy Ellis received stereotactic radiosurgery to the following targets: pituitary target was treated using 4 Rapid Arc VMAT Beams to a fraction of 5 Gy to be repeated until prescription dose of 25 Gy.  ExacTrac registration was performed for each couch angle.  The 100% isodose line was prescribed.  6 MV X-rays were delivered in the flattening filter free beam mode.  STEREOTACTIC TREATMENT MANAGEMENT:  Following delivery, the patient was transported to nursing in stable condition and monitored for possible acute effects.  Vital signs were recorded BP 151/60 mmHg   Pulse 66  Temp(Src) 97.8 F (36.6 C) (Oral)  Resp 18  SpO2 97%. The patient tolerated treatment without significant acute effects, and was discharged to home in stable condition.    PLAN: Complete 5 fractions and follow-up in one month.  ________________________________  Sheral Apley. Tammi Klippel, M.D.   This document serves as a record of services personally performed by Tyler Pita, MD. It was created on his behalf by Arlyce Harman, a trained medical scribe. The creation of this record is based on the scribe's personal observations and the provider's statements to them. This document has been checked and approved by the attending provider.

## 2016-01-15 ENCOUNTER — Ambulatory Visit
Admission: RE | Admit: 2016-01-15 | Discharge: 2016-01-15 | Disposition: A | Discharge status: Home / Self Care | Payer: BC Managed Care – PPO | Source: Ambulatory Visit | Attending: Radiation Oncology | Admitting: Radiation Oncology

## 2016-01-15 VITALS — BP 155/84 | HR 69 | Temp 97.7°F | Resp 16

## 2016-01-15 DIAGNOSIS — D352 Benign neoplasm of pituitary gland: Secondary | ICD-10-CM

## 2016-01-15 DIAGNOSIS — Z510 Encounter for antineoplastic radiation therapy: Secondary | ICD-10-CM | POA: Diagnosis not present

## 2016-01-15 NOTE — Progress Notes (Signed)
  Radiation Oncology         [phone] ________________________________  Stereotactic Treatment Procedure Note  Name: Cindy Ellis  MRN: [id number]  Date: 01/15/2016  DOB: Oct 22, 1956  SPECIAL TREATMENT PROCEDURE    ICD-9-CM ICD-10-CM   1. 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  It showed 3-dimensional radiation distributions overlaid onto the planning CT/MRI image set.  The [facility] for the target structures as well as the organs at risk were reviewed. The documentation of the 3D plan and dosimetry are filed in the radiation oncology EMR.  NARRATIVE:  Cindy Ellis was brought to the TrueBeam stereotactic radiation treatment machine and placed supine on the CT couch. The head frame was applied, and the patient was set up for stereotactic radiosurgery.  Neurosurgery was present for the set-up and delivery  SIMULATION VERIFICATION:  In the couch zero-angle position, the patient underwent Exactrac imaging using the Brainlab system with orthogonal KV images.  These were carefully aligned and repeated to confirm treatment position for each of the isocenters.  The Exactrac snap film verification was repeated at each couch angle.  PROCEDURE: Cindy Ellis received stereotactic radiosurgery to the following targets: pituitary target was treated using 4 Rapid Arc VMAT Beams to a fraction of 5 Gy to be repeated until prescription dose of 25 Gy.  ExacTrac registration was performed for each couch angle.  The 100% isodose line was prescribed.  6 MV X-rays were delivered in the flattening filter free beam mode.  STEREOTACTIC TREATMENT MANAGEMENT:  Following delivery, the patient was transported to nursing in stable condition and monitored for possible acute effects.  Vital signs were recorded BP 155/84 mmHg   Pulse 69  Temp(Src) 97.7 F (36.5 C) (Oral)  Resp 16  SpO2 100%. The patient tolerated treatment without significant acute effects, and was discharged to home in stable condition.    PLAN: Complete 5 fractions and follow-up in one month.  ________________________________  Sheral Apley. Tammi Klippel, M.D.   This document serves as a record of services personally performed by Tyler Pita, MD. It was created on his behalf by Arlyce Harman, a trained medical scribe. The creation of this record is based on the scribe's personal observations and the provider's statements to them. This document has been checked and approved by the attending provider.

## 2016-01-15 NOTE — Progress Notes (Signed)
Nurse monitoring following SRS treatment complete. Vitals stable. Denies pain. Denies headache, dizziness, nausea, diplopia or ringing in the ears. Patient discharged home ambulatory with her grandchildren. Patient understands to avoid strenuous activity for the next 24 hours and call [phone] with needs. Patient confirms she will return for her final treatment on Wednesday.

## 2016-01-17 ENCOUNTER — Encounter: Payer: Self-pay | Admitting: Radiation Oncology

## 2016-01-17 ENCOUNTER — Ambulatory Visit
Admission: RE | Admit: 2016-01-17 | Discharge: 2016-01-17 | Disposition: A | Discharge status: Home / Self Care | Payer: BC Managed Care – PPO | Source: Ambulatory Visit | Attending: Radiation Oncology | Admitting: Radiation Oncology

## 2016-01-17 VITALS — BP 144/68 | HR 61 | Temp 97.6°F

## 2016-01-17 DIAGNOSIS — D352 Benign neoplasm of pituitary gland: Secondary | ICD-10-CM

## 2016-01-17 DIAGNOSIS — Z510 Encounter for antineoplastic radiation therapy: Secondary | ICD-10-CM | POA: Diagnosis not present

## 2016-01-17 NOTE — Progress Notes (Signed)
Cindy Ellis received [facility] treatment today.  Reports mild headache, but denies any dizziness, nausea, nor vision changes.  VSS.  Discharged to home with granddaughter. Gait steady. Advised to rest as much as possible and avoid any activities requiring heavy lifting or straining and she stated understanding.  Discharged to home with granddaughter. Observation for 20 minutes.

## 2016-01-19 NOTE — Progress Notes (Signed)
  Radiation Oncology         [phone] ________________________________  Stereotactic Treatment Procedure Note  Name: Cindy Ellis  MRN: [id number]  Date: 01/17/2016  DOB: 1956/06/28  SPECIAL TREATMENT PROCEDURE    ICD-9-CM ICD-10-CM   1. 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  It showed 3-dimensional radiation distributions overlaid onto the planning CT/MRI image set.  The [facility] for the target structures as well as the organs at risk were reviewed. The documentation of the 3D plan and dosimetry are filed in the radiation oncology EMR.  NARRATIVE:  Cindy Ellis was brought to the TrueBeam stereotactic radiation treatment machine and placed supine on the CT couch. The head frame was applied, and the patient was set up for stereotactic radiosurgery.  Neurosurgery was present for the set-up and delivery  SIMULATION VERIFICATION:  In the couch zero-angle position, the patient underwent Exactrac imaging using the Brainlab system with orthogonal KV images.  These were carefully aligned and repeated to confirm treatment position for each of the isocenters.  The Exactrac snap film verification was repeated at each couch angle.  PROCEDURE: Cindy Ellis received stereotactic radiosurgery to the following targets: pituitary target was treated using 4 Rapid Arc VMAT Beams to a fraction of 5 Gy to be repeated until prescription dose of 25 Gy.  ExacTrac registration was performed for each couch angle.  The 100% isodose line was prescribed.  6 MV X-rays were delivered in the flattening filter free beam mode.  STEREOTACTIC TREATMENT MANAGEMENT:  Following delivery, the patient was transported to nursing in stable condition and monitored for possible acute effects.  Vital signs were recorded BP 144/68 mmHg   Pulse 61  Temp(Src) 97.6 F (36.4 C). The patient tolerated treatment without significant acute effects, and was discharged to home in stable condition.    PLAN: Complete and follow-up in one month.  ________________________________  Sheral Apley. Tammi Klippel, M.D.   This document serves as a record of services personally performed by Tyler Pita, MD. It was created on his behalf by Arlyce Harman, a trained medical scribe. The creation of this record is based on the scribe's personal observations and the provider's statements to them. This document has been checked and approved by the attending provider.

## 2016-01-28 NOTE — Progress Notes (Signed)
  Radiation Oncology         [phone] ________________________________  Name: Cindy Ellis MRN: [id number]  Date: 01/17/2016  DOB: 09-20-1956  End of Treatment Note  ICD-9-CM ICD-10-CM     1. 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  ExacTrac registration was performed for each couch angle.  The 100% isodose line was prescribed.  6 MV X-rays were delivered in the flattening filter free beam mode.  Narrative: The patient tolerated radiation treatment relatively well.   No acute complications occurred.  Plan: The patient has completed radiation treatment. The patient will return to radiation oncology clinic for routine followup in one month. I advised her to call or return sooner if she has any questions or concerns related to her recovery or treatment. ________________________________  Sheral Apley. Tammi Klippel, M.D.

## 2016-02-19 ENCOUNTER — Ambulatory Visit
Admission: RE | Admit: 2016-02-19 | Discharge: 2016-02-19 | Disposition: A | Discharge status: Home / Self Care | Payer: BC Managed Care – PPO | Source: Ambulatory Visit | Attending: Radiation Oncology | Admitting: Radiation Oncology

## 2016-02-19 ENCOUNTER — Encounter: Payer: Self-pay | Admitting: Radiation Oncology

## 2016-02-19 VITALS — BP 126/86 | HR 72 | Resp 18 | Wt 291.6 lb

## 2016-02-19 DIAGNOSIS — D352 Benign neoplasm of pituitary gland: Secondary | ICD-10-CM

## 2016-02-19 NOTE — Progress Notes (Signed)
Radiation Oncology         (336) [phone] ________________________________  Name: Cindy Ellis  MRN: [id number]  Date: 02/19/2016  DOB: [birth year]  Follow-Up Visit Note    CC: Kandice Hams, MD  Jovita Gamma, MD  Diagnosis:   60 yo woman with a recurrent Pituitary adenoma.    ICD-9-CM ICD-10-CM   1. Pituitary adenoma with extrasellar extension ([facility]) 227.3 D35.2     Interval Since Last Radiation:  1 month. Completed radiation 01/08/2016-01/17/2016 to the pituitary using 4 Rapid Arc VMAT Beams to a fraction of 5 Gy repeated to a prescription dose of 25 Gy.  Narrative:  The patient returns today for routine follow-up.  Weight and vitals stable. Denies pain. Reports continued difficulty seeing detail far away. Denies diplopia. Reports she fatigues easily. Reports she continues to exercise 3-4 times per week. Reports only two slight headaches since completion of xrt. Denies nausea, vomiting or ringing in the ears.                              ALLERGIES:  has No Known Allergies.  Meds: Current Outpatient Prescriptions  Medication Sig Dispense Refill  . amLODipine (NORVASC) 5 MG tablet Take 5 mg by mouth.    Marland Kitchen atenolol (TENORMIN) 25 MG tablet Take 25 mg by mouth.    . Calcium Carb-Cholecalciferol (CALCIUM 600/VITAMIN D3) 600-800 MG-UNIT TABS Take 2 capsules by mouth daily.    . Cholecalciferol (VITAMIN D3) 2000 UNITS capsule Take 2,000 Units by mouth daily.    . ferrous sulfate 325 (65 FE) MG tablet Take 325 mg by mouth daily with breakfast.    . Garlic 123XX123 MG CAPS Take 1,000 mg by mouth daily.    Marland Kitchen levothyroxine (SYNTHROID, LEVOTHROID) 175 MCG tablet Take 150 mcg by mouth daily before breakfast.     . lisinopril (PRINIVIL,ZESTRIL) 40 MG tablet Take 40 mg by mouth daily.     . Omega 3 1000 MG CAPS Take 1,000 mg by mouth daily.    . rosuvastatin (CRESTOR) 20 MG tablet Take 20 mg by mouth daily.    Marland Kitchen SM MULTIPLE VITAMINS/IRON TABS Take 1 tablet by mouth daily.     Marland Kitchen  triamterene-hydrochlorothiazide (MAXZIDE-25) 37.5-25 MG tablet Take by mouth.    . vitamin B-12 (CYANOCOBALAMIN) 1000 MCG tablet Take 1,000 mcg by mouth daily.    Marland Kitchen HYDROcodone-acetaminophen (NORCO/VICODIN) 5-325 MG tablet Take 1-2 tablets by mouth every 4 (four) hours as needed for moderate pain. (Patient not taking: Reported on 12/25/2015) 75 tablet 0   No current facility-administered medications for this encounter.    Physical Findings: The patient is in no acute distress. Patient is alert and oriented.  weight is 291 lb 9.6 oz (132.269 kg). Her blood pressure is 126/86 and her pulse is 72. Her respiration is 18 and oxygen saturation is 100%. .  No significant changes.  Lab Findings: Lab Results  Component Value Date   WBC 13.0* 10/12/2015   HGB 14.0 10/12/2015   HCT 43.2 10/12/2015   PLT 253 10/12/2015    Lab Results  Component Value Date   NA 139 10/13/2015   K 4.4 10/13/2015   CO2 25 10/13/2015   GLUCOSE 111* 10/13/2015   BUN 18.1 12/29/2015   BUN 16 10/13/2015   CREATININE 0.8 12/29/2015   CREATININE 0.90 10/13/2015   CALCIUM 9.6 10/13/2015   ANIONGAP 8 10/13/2015    Radiographic Findings: No results found.  Impression:  The  patient is recovering from the effects of radiation.  Plan:  She will have an MRI at 6 months. I will follow up with her after. We discussed that if she is having headaches or other symptoms, we can schedule the MRI sooner.  _____________________________________  Sheral Apley Tammi Klippel, M.D.    This document serves as a record of services personally performed by Tyler Pita, MD. It was created on his behalf by Lendon Collar, a trained medical scribe. The creation of this record is based on the scribe's personal observations and the provider's statements to them. This document has been checked and approved by the attending provider.

## 2016-02-19 NOTE — Progress Notes (Signed)
Weight and vitals stable. Denies pain. Reports continued difficulty seeing detail far away. Denies diplopia. Reports she fatigues easily. Reports she continues to exercise 3-4 times per week. Reports only two slight headaches since completion of xrt. Denies nausea, vomiting or ringing in the ears.   BP 126/86 mmHg  Pulse 72  Resp 18  Wt 291 lb 9.6 oz (132.269 kg)  SpO2 100%   Wt Readings from Last 3 Encounters:  02/19/16 291 lb 9.6 oz (132.269 kg)  12/29/15 288 lb 1.6 oz (130.681 kg)  12/25/15 285 lb 9.6 oz (129.547 kg)

## 2016-05-23 ENCOUNTER — Other Ambulatory Visit: Payer: Self-pay | Admitting: Internal Medicine

## 2016-05-23 DIAGNOSIS — Z1231 Encounter for screening mammogram for malignant neoplasm of breast: Secondary | ICD-10-CM

## 2016-05-30 ENCOUNTER — Ambulatory Visit: Payer: BC Managed Care – PPO

## 2016-06-05 ENCOUNTER — Ambulatory Visit: Payer: BC Managed Care – PPO

## 2016-06-12 ENCOUNTER — Ambulatory Visit
Admission: RE | Admit: 2016-06-12 | Discharge: 2016-06-12 | Disposition: A | Discharge status: Home / Self Care | Payer: BC Managed Care – PPO | Source: Ambulatory Visit | Attending: Internal Medicine | Admitting: Internal Medicine

## 2016-06-12 DIAGNOSIS — Z1231 Encounter for screening mammogram for malignant neoplasm of breast: Secondary | ICD-10-CM

## 2016-07-16 ENCOUNTER — Other Ambulatory Visit: Payer: Self-pay | Admitting: Radiation Therapy

## 2016-07-16 DIAGNOSIS — D352 Benign neoplasm of pituitary gland: Secondary | ICD-10-CM

## 2016-07-26 ENCOUNTER — Ambulatory Visit
Admission: RE | Admit: 2016-07-26 | Discharge: 2016-07-26 | Disposition: A | Discharge status: Home / Self Care | Payer: BC Managed Care – PPO | Source: Ambulatory Visit | Attending: Radiation Oncology | Admitting: Radiation Oncology

## 2016-07-26 DIAGNOSIS — D352 Benign neoplasm of pituitary gland: Secondary | ICD-10-CM

## 2016-07-26 MED ORDER — GADOBENATE DIMEGLUMINE 529 MG/ML IV SOLN
20.0000 mL | Freq: Once | INTRAVENOUS | Status: AC | PRN
  Administered 2016-07-26: 20 mL via INTRAVENOUS

## 2016-07-30 ENCOUNTER — Encounter: Payer: Self-pay | Admitting: Genetic Counselor

## 2016-07-31 ENCOUNTER — Encounter: Payer: Self-pay | Admitting: Radiation Oncology

## 2016-07-31 ENCOUNTER — Ambulatory Visit
Admission: RE | Admit: 2016-07-31 | Discharge: 2016-07-31 | Disposition: A | Discharge status: Home / Self Care | Payer: BC Managed Care – PPO | Source: Ambulatory Visit | Attending: Radiation Oncology | Admitting: Radiation Oncology

## 2016-07-31 VITALS — BP 144/55 | HR 52 | Temp 97.8°F | Ht 65.0 in | Wt 292.0 lb

## 2016-07-31 DIAGNOSIS — E890 Postprocedural hypothyroidism: Secondary | ICD-10-CM | POA: Insufficient documentation

## 2016-07-31 DIAGNOSIS — I10 Essential (primary) hypertension: Secondary | ICD-10-CM | POA: Insufficient documentation

## 2016-07-31 DIAGNOSIS — Z8249 Family history of ischemic heart disease and other diseases of the circulatory system: Secondary | ICD-10-CM | POA: Insufficient documentation

## 2016-07-31 DIAGNOSIS — Z86010 Personal history of colonic polyps: Secondary | ICD-10-CM | POA: Diagnosis not present

## 2016-07-31 DIAGNOSIS — Z9841 Cataract extraction status, right eye: Secondary | ICD-10-CM | POA: Insufficient documentation

## 2016-07-31 DIAGNOSIS — Z90710 Acquired absence of both cervix and uterus: Secondary | ICD-10-CM | POA: Diagnosis not present

## 2016-07-31 DIAGNOSIS — C73 Malignant neoplasm of thyroid gland: Secondary | ICD-10-CM | POA: Diagnosis not present

## 2016-07-31 DIAGNOSIS — D509 Iron deficiency anemia, unspecified: Secondary | ICD-10-CM | POA: Insufficient documentation

## 2016-07-31 DIAGNOSIS — E785 Hyperlipidemia, unspecified: Secondary | ICD-10-CM | POA: Diagnosis not present

## 2016-07-31 DIAGNOSIS — Z9842 Cataract extraction status, left eye: Secondary | ICD-10-CM | POA: Diagnosis not present

## 2016-07-31 DIAGNOSIS — Z923 Personal history of irradiation: Secondary | ICD-10-CM | POA: Insufficient documentation

## 2016-07-31 DIAGNOSIS — D352 Benign neoplasm of pituitary gland: Secondary | ICD-10-CM

## 2016-07-31 NOTE — Progress Notes (Signed)
Radiation Oncology         [phone] ________________________________  Name: Cindy Ellis  MRN: [id number]  Date: 07/31/2016  DOB: 1956-02-07  Follow-Up Visit Note    CC: Kandice Hams, MD  Jovita Gamma, MD  Diagnosis:  60 y.o.  woman with a recurrent Pituitary adenoma.    ICD-9-CM ICD-10-CM   1. Pituitary adenoma with extrasellar extension ([place]) 227.3 D35.2 Ambulatory Referral to Genetics  2. Thyroid cancer ([place]) [place] Ambulatory Referral to Genetics    Interval Since Last Radiation:  6 months  01/08/2016-01/17/2016: SRS to the pituitary using 4 Rapid Arc VMAT Beams to a fraction of 5 Gy repeated to a prescription dose of 25 Gy.  Narrative:  Cindy Ellis is a pleasant 60 y.o. woman who was diagnosed in June 2015 with a pituitary adenoma. The patient had had bilateral cataract extraction and was not noting significant improvement in her visual acuity. Ultimately an MRI of the brain on 03/22/2014 revealed a 2.3 x 2.1 x 3.4 cm centrally necrotic pituitary macroadenoma. She subsequently underwent transsphenoidal resection of this mass on 04/25/2014. Final pathology was consistent with pituitary adenoma. She has been followed closely with Dr. Sherwood Gambler, however began to experience changes in her vision in approximately November 2016. She was taken back to the operating room on 10/11/2015 where she underwent a second transsphenoidal resection and 1.1 cm in aggregate was submitted consistent with persistent pituitary adenoma. Postsurgical MRI was repeated on 12/11/2015 revealing oval resection of recurrent pituitary adenoma however more normal enhancing pituitary tissue is evident on the right, slight drooping of the optic chiasm was seen since prior exam. Of note she also had a history of a thyroid cancer  and received radioactive iodine in 2016. She has been followed by Dr. Buddy Duty since. She went on to receive [facility] radiotherapy. She has been followed since and returns today for an MRI review. She  had this performed on 07/26/16 revealing stability of the pituitary lesion, which measures 8 x 9 x 8 mm.   On review of systems, the patient reports that she is doing well overall. She denies any chest pain, shortness of breath, cough, fevers, chills, night sweats, unintended weight changes. She has been to see Dr. Buddy Duty recently and reports her TSH levels have been stable and he has not made any changes to her medications. She denies headaches, auditory disturbances, or hot flashes. She has noticed some haziness to her vision but reports she has not been seen by an opthalmologist in about a year. She denies any bowel or bladder disturbances, and denies abdominal pain, nausea or vomiting. She denies any new musculoskeletal or joint aches or pains. A complete review of systems is obtained and is otherwise negative.   Past Medical History:  Past Medical History:  Diagnosis Date  . Anemia    hx of;when she was younger  . Cancer [facility])    thyroid cancer  . History of blood transfusion    "related to hysterectomy/fibroids"  . History of colon polyps   . Hyperlipidemia    takes Atorvastatin daily  . Hypertension    takes Lisinopril,Atenolol,Amlodipine,and Triam-HCTZ daily  . Iron deficiency anemia   . Neoplasm of thyroid   . Pituitary tumor ([place])   . Wears glasses     Past Surgical History: Past Surgical History:  Procedure Laterality Date  . ABDOMINAL HYSTERECTOMY  1999  . CARPAL TUNNEL RELEASE Bilateral 2014  . CATARACT EXTRACTION W/ INTRAOCULAR LENS  IMPLANT, BILATERAL Bilateral 2015  .  COLONOSCOPY    . CRANIOTOMY N/A 04/25/2014   Procedure: CRANIOTOMY HYPOPHYSECTOMY TRANSNASAL APPROACH;  Surgeon: Hosie Spangle, MD;  Location: [place] NEURO ORS;  Service: Neurosurgery;  Laterality: N/A;  Transphenoidal resection of pituitary tumor Dr Wilburn Cornelia to approach  . CRANIOTOMY N/A 10/11/2015   Procedure: Transphenoidal resection of pituitary tumor;  Surgeon: Jovita Gamma, MD;  Location: [place] NEURO  ORS;  Service: Neurosurgery;  Laterality: N/A;  Transphenoidal resection of pituitary tumor  . THYROIDECTOMY Right 10/12/2014   Procedure: RIGHT HEMI-THYROIDECTOMY WITH NIMS MONITOR;  Surgeon: Jerrell Belfast, MD;  Location: [place];  Service: ENT;  Laterality: Right;  . THYROIDECTOMY Left 12/23/2014   Procedure: THYROIDECTOMY LEFT SIDE;  Surgeon: Jerrell Belfast, MD;  Location: [place];  Service: ENT;  Laterality: Left;  Completion thyroidectomy   . THYROIDECTOMY, PARTIAL Right 10/12/2014   hemi  . THYROIDECTOMY, PARTIAL Left 12/23/2014   DR SHOEMAKER  . TRANSNASAL APPROACH N/A 04/25/2014   Procedure: TRANSNASAL APPROACH;  Surgeon: Jerrell Belfast, MD;  Location: [facility];  Service: ENT;  Laterality: N/A;  TRANSNASAL APPROACH  . TRANSNASAL APPROACH N/A 10/11/2015   Procedure: TRANSNASAL APPROACH;  Surgeon: Jerrell Belfast, MD;  Location: [place] NEURO ORS;  Service: ENT;  Laterality: N/A;  . TUBAL LIGATION  1980    Social History:  Social History   Social History  . Marital status: Divorced    Spouse name: N/A  . Number of children: N/A  . Years of education: N/A   Occupational History  . Not on file.   Social History Main Topics  . Smoking status: Never Smoker  . Smokeless tobacco: Never Used  . Alcohol use No  . Drug use: No  . Sexual activity: Not Currently   Other Topics Concern  . Not on file   Social History Narrative  . 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  Family History: Family History  Problem Relation Age of Onset  . Hypertension Other     ALLERGIES:  has No Known Allergies.  Meds: Current Outpatient Prescriptions  Medication Sig Dispense Refill  . amLODipine (NORVASC) 5 MG tablet Take 5 mg by mouth.    Marland Kitchen atenolol (TENORMIN) 25 MG tablet Take 25 mg by mouth.    . Calcium Carb-Cholecalciferol (CALCIUM 600/VITAMIN D3) 600-800 MG-UNIT TABS Take 2 capsules by mouth daily.    .  Cholecalciferol (VITAMIN D3) 2000 UNITS capsule Take 2,000 Units by mouth daily.    . ferrous sulfate 325 (65 FE) MG tablet Take 325 mg by mouth daily with breakfast.    . Garlic 123XX123 MG CAPS Take 1,000 mg by mouth daily.    Marland Kitchen levothyroxine (SYNTHROID, LEVOTHROID) 175 MCG tablet Take 150 mcg by mouth daily before breakfast.     . lisinopril (PRINIVIL,ZESTRIL) 40 MG tablet Take 40 mg by mouth daily.     . Omega 3 1000 MG CAPS Take 1,000 mg by mouth daily.    . rosuvastatin (CRESTOR) 20 MG tablet Take 20 mg by mouth daily.    Marland Kitchen SM MULTIPLE VITAMINS/IRON TABS Take 1 tablet by mouth daily.     Marland Kitchen triamterene-hydrochlorothiazide (MAXZIDE-25) 37.5-25 MG tablet Take by mouth.    . vitamin B-12 (CYANOCOBALAMIN) 1000 MCG tablet Take 1,000 mcg by mouth daily.     No current facility-administered medications for this encounter.     Physical Findings:  height is 5\' 5"  (1.651 m) and weight is  292 lb (132.5 kg). Her oral temperature is 97.8 F (36.6 C). Her blood pressure is 144/55 (abnormal) and her pulse is 52 (abnormal).   In general this is a well appearing African American female in no acute distress.In general this is a well appearing female in no acute distress. She's alert and oriented x4 and appropriate throughout the examination. Cardiopulmonary assessment is negative for acute distress and she exhibits normal effort. HEENT reveals that she is normocephalic, atraumatic, EOMs are intact. She does not have any gait disturbances or gross focal neurologic deficits.     Lab Findings: Lab Results  Component Value Date   WBC 13.0 (H) 10/12/2015   HGB 14.0 10/12/2015   HCT 43.2 10/12/2015   PLT 253 10/12/2015    Lab Results  Component Value Date   NA 139 10/13/2015   K 4.4 10/13/2015   CO2 25 10/13/2015   GLUCOSE 111 (H) 10/13/2015   BUN 18.1 12/29/2015   CREATININE 0.8 12/29/2015   CALCIUM 9.6 10/13/2015   ANIONGAP 8 10/13/2015    Radiographic Findings: Mr Jeri Cos [id number] Contrast  Result  Date: 07/26/2016 CLINICAL DATA:  60 year old female status post resection of recurrent pituitary adenoma in December 2016, followed by radiosurgery to the pituitary in March 2017. Restaging. Subsequent encounter. Creatinine was obtained on site at [place] at [address] Results: Creatinine 0.7 mg/dL. EXAM: MRI HEAD WITHOUT AND WITH CONTRAST TECHNIQUE: Multiplanar, multiecho pulse sequences of the brain and surrounding structures were obtained without and with intravenous contrast. CONTRAST:  23mL MULTIHANCE GADOBENATE DIMEGLUMINE 529 MG/ML IV SOLN COMPARISON:  Postoperative brain MRI 12/11/2015 and earlier. FINDINGS: Brain: Stable cerebral volume. No restricted diffusion to suggest acute infarction. No midline shift, mass effect, evidence of mass lesion, ventriculomegaly, extra-axial collection or acute intracranial hemorrhage. Cervicomedullary junction within normal limits. Small chronic lacunar infarct in the left caudate again noted. Superimposed mild to moderate for age scattered and patchy nonspecific cerebral white matter T2 and FLAIR hyperintensity. No cortical encephalomalacia identified. Small chronic micro hemorrhages in the left basal ganglia and ventral right thalamus re- demonstrated. Chronic lacunar infarct of the right pons unchanged. Outside of the pituitary region, no abnormal intracranial enhancement. See pituitary findings below. Vascular: Major intracranial vascular flow voids are stable. Fetal type bilateral PCA origins suspected. Skull and upper cervical spine: Chronic disc and endplate degeneration without visible stenosis. Stable bone marrow signal. Sinuses/Orbits: Stable and negative orbits soft tissues. Mastoids are clear. Visible internal auditory structures appear normal. Other: Dedicated pituitary imaging. Previous pituitary resection. Stable leftward deviation of the infundibulum. Small volume Christian tick homogeneously enhancing soft tissue along the floor of the  sella turcica compatible with residual pituitary parenchyma. Located along the posterior floor of the sella in the midline and extending to the right is a nodular area of hypoenhancement encompassing 8 x 9 x 8 mm, and stable in size and configuration. The lesion no longer has intrinsic T1 hyperintensity which might have been related to some postoperative blood on the prior study. No suprasellar extension or suprasellar mass effect. Negative hypothalamus. Cavernous sinus appears stable and within normal limits. Superimposed postoperative changes to the sphenoid sinuses with improved aeration. Continued subtotal opacification of the left maxillary sinus which appears related primarily to mucous retention cyst. Continued subtotal opacification of the left frontal sinus and anterior ethmoids which appears primarily due to mucosal thickening. IMPRESSION: 1. Residual 8-9 mm nodular area of hypoenhancement in the left aspect of the sella turcica compatible with small residual adenoma.  This study will serve as a new post treatment baseline. No cavernous sinus or suprasellar extension. 2. Otherwise stable MRI appearance of the brain with chronic small vessel disease. 3. Stable obstructive pattern left paranasal sinusitis. Electronically Signed   By: Genevie Ann M.D.   On: 07/26/2016 13:55    Impression/Plan: 1. Pituitary adenoma. The patient's films were presented this morning in neuroncology conference. It appears that she is doing well overall without evidence of progression or recurrence. We will plan for repeat MRI in 6 months time. Dr. Sherwood Gambler would like to see the patient at her next visit. I will plan to see her back in 1 year, but contact her by phone for her next MRI scan as well while we follow along with neurosurgery.  2. Hypothyroidism resulting from treatment of thyroid cancer. The patient continues to follow up with Dr. Buddy Duty and we will obtain his most recent notes and labs to track her TSH along with him.    3. Hazy visual changes. The patient will follow up with her opthalmologist as well and we have asked that she give her provider our fax number to copy [place] on office notes. 4. Possible genetic predisposition to pituitary adenoma and thyroid cancer. The patient has been discussed with our genetic counselor and the patient is presented with this option. She is interested in meeting with genetics to discuss possible testing. A referral was placed for this.     Carola Rhine, PAC

## 2016-07-31 NOTE — Progress Notes (Addendum)
Ms. Torrez here for results of her MR Brain obtained on 9/33/17. XRT for Pituitary Adenoma  She denies any headaches, vomiting, ataxia or issues with fine motor movement,  but reports that her vision is "hazy".     BP (!) 144/55 (BP Location: Right Arm, Patient Position: Sitting, Cuff Size: Large)   Pulse (!) 52   Temp 97.8 F (36.6 C) (Oral)   Ht 5\' 5"  (1.651 m)   Wt 292 lb (132.5 kg)   BMI 48.59 kg/m    Wt Readings from Last 3 Encounters:  07/31/16 292 lb (132.5 kg)  02/19/16 291 lb 9.6 oz (132.3 kg)  12/29/15 288 lb 1.6 oz (130.7 kg)

## 2016-07-31 NOTE — Addendum Note (Signed)
Encounter addended by: Benn Moulder, RN on: 07/31/2016  4:49 PM<BR>    Actions taken: Charge Capture section accepted

## 2016-08-01 ENCOUNTER — Ambulatory Visit: Payer: Self-pay | Admitting: Radiation Oncology

## 2016-08-02 ENCOUNTER — Encounter: Payer: Self-pay | Admitting: *Deleted

## 2016-08-02 NOTE — Progress Notes (Addendum)
Received TSH from Dr.  Thornell Sartorius office today after requesting yesterday and today.  TSH 0.05 ( low) with normal range is 0.34 -4.50.  Drawn on 05/15/16

## 2016-09-11 ENCOUNTER — Encounter: Payer: Self-pay | Admitting: Genetic Counselor

## 2016-09-11 ENCOUNTER — Other Ambulatory Visit: Payer: BC Managed Care – PPO

## 2016-09-11 ENCOUNTER — Ambulatory Visit (HOSPITAL_BASED_OUTPATIENT_CLINIC_OR_DEPARTMENT_OTHER): Payer: BC Managed Care – PPO | Admitting: Genetic Counselor

## 2016-09-11 DIAGNOSIS — Z808 Family history of malignant neoplasm of other organs or systems: Secondary | ICD-10-CM | POA: Diagnosis not present

## 2016-09-11 DIAGNOSIS — D352 Benign neoplasm of pituitary gland: Secondary | ICD-10-CM | POA: Diagnosis not present

## 2016-09-11 DIAGNOSIS — C73 Malignant neoplasm of thyroid gland: Secondary | ICD-10-CM | POA: Diagnosis not present

## 2016-09-11 DIAGNOSIS — Z800 Family history of malignant neoplasm of digestive organs: Secondary | ICD-10-CM | POA: Diagnosis not present

## 2016-09-11 DIAGNOSIS — Z315 Encounter for genetic counseling: Secondary | ICD-10-CM

## 2016-09-11 NOTE — Progress Notes (Signed)
REFERRING PROVIDER: Seward Carol, MD [address] [place], [place] [address]   Cindy Simpson, PA-C  Tyler Pita, MD  PRIMARY PROVIDER:  Kandice Hams, MD  PRIMARY REASON FOR VISIT:  1. Pituitary adenoma with extrasellar extension ([place])   2. Thyroid cancer ([place])   3. Family history of pancreatic cancer      HISTORY OF PRESENT ILLNESS:   Cindy Ellis, a 60 y.o. female, was seen for a [place] cancer genetics consultation at the request of Cindy Simpson, PA-C due to a personal and family history of cancer.  Cindy Ellis presents to clinic today to discuss the possibility of a hereditary predisposition to cancer, genetic testing, and to further clarify her future cancer risks, as well as potential cancer risks for family members.   In 2015, at the age of 68, Cindy Ellis was diagnosed with a pituitary tumor.  This was surgically removed.  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  The thyroid cancer was removed as well as the tumor.      CANCER HISTORY:   No history exists.     HORMONAL RISK FACTORS:  Menarche was at age 66.  First live birth at age 58.  OCP use for approximately 9 years.  Ovaries intact: yes.  Hysterectomy: yes.  Menopausal status: postmenopausal.  HRT use: 0 years. Colonoscopy: yes; 3 polyps. Mammogram within the last year: yes. Number of breast biopsies: 0. Up to date with pelvic exams:  Not needed based on TAH. Any excessive radiation exposure in the past:  For treatment of pituitary tumor  Past Medical History:  Diagnosis Date  . Anemia    hx of;when she was younger  . Cancer [facility])    thyroid cancer  . Family history of pancreatic cancer   . History of blood transfusion    "related to hysterectomy/fibroids"  . History of colon polyps   . Hyperlipidemia    takes Atorvastatin daily  . Hypertension    takes Lisinopril,Atenolol,Amlodipine,and Triam-HCTZ daily  . Iron  deficiency anemia   . Neoplasm of thyroid   . Pituitary tumor   . Wears glasses     Past Surgical History:  Procedure Laterality Date  . ABDOMINAL HYSTERECTOMY  1999  . CARPAL TUNNEL RELEASE Bilateral 2014  . CATARACT EXTRACTION W/ INTRAOCULAR LENS  IMPLANT, BILATERAL Bilateral 2015  . COLONOSCOPY    . CRANIOTOMY N/A 04/25/2014   Procedure: CRANIOTOMY HYPOPHYSECTOMY TRANSNASAL APPROACH;  Surgeon: Hosie Spangle, MD;  Location: [place] NEURO ORS;  Service: Neurosurgery;  Laterality: N/A;  Transphenoidal resection of pituitary tumor Dr Wilburn Cornelia to approach  . CRANIOTOMY N/A 10/11/2015   Procedure: Transphenoidal resection of pituitary tumor;  Surgeon: Jovita Gamma, MD;  Location: [place] NEURO ORS;  Service: Neurosurgery;  Laterality: N/A;  Transphenoidal resection of pituitary tumor  . THYROIDECTOMY Right 10/12/2014   Procedure: RIGHT HEMI-THYROIDECTOMY WITH NIMS MONITOR;  Surgeon: Jerrell Belfast, MD;  Location: [place];  Service: ENT;  Laterality: Right;  . THYROIDECTOMY Left 12/23/2014   Procedure: THYROIDECTOMY LEFT SIDE;  Surgeon: Jerrell Belfast, MD;  Location: [place];  Service: ENT;  Laterality: Left;  Completion thyroidectomy   . THYROIDECTOMY, PARTIAL Right 10/12/2014   hemi  . THYROIDECTOMY, PARTIAL Left 12/23/2014   DR SHOEMAKER  . TRANSNASAL APPROACH N/A 04/25/2014   Procedure: TRANSNASAL APPROACH;  Surgeon: Jerrell Belfast, MD;  Location: [facility];  Service: ENT;  Laterality: N/A;  TRANSNASAL APPROACH  .  TRANSNASAL APPROACH N/A 10/11/2015   Procedure: TRANSNASAL APPROACH;  Surgeon: Jerrell Belfast, MD;  Location: [facility]S;  Service: ENT;  Laterality: N/A;  . [place] History   Social History  . Marital status: Divorced    Spouse name: N/A  . Number of children: N/A  . Years of education: N/A   Social History Main Topics  . Smoking status: Never Smoker  . Smokeless tobacco: Never Used  . Alcohol use No  . Drug use: No  . Sexual activity: Not  Currently   Other Topics Concern  . None   Social History Narrative  . None     FAMILY HISTORY:  We obtained a detailed, 4-generation family history.  Significant diagnoses are listed below: Family History  Problem Relation Age of Onset  . Hypertension Mother   . Stroke Mother   . Heart disease Father   . Hypertension Other   . Cervical cancer Maternal Aunt   . Pancreatic cancer Maternal Grandmother 21  . Stroke Maternal Grandfather     The patient has three daughters who are cancer free.  She has a paternal half sister who she has not kept in contact with and has no information on.  Her parents are both deceased.  Her mother died of complications from hypertension and her father died of a heart problem.  Her mother had one maternal half sister and 8 paternal half siblings.  Her maternal half sister died at 12 and had cervical cancer.  Her maternal grandmother had pancreatic cancer at 35.    The patient's father had three brothers and three sisters.  There is no reported cancer on this side of the family.  Patient's maternal ancestors are of [place], native [place] and caucasian descent, and paternal ancestors are of [place] American and [place] descent. There is no reported Ashkenazi Jewish ancestry. There is no known consanguinity.  GENETIC COUNSELING ASSESSMENT: Cindy Ellis is a 60 y.o. female with a personal history of a pituitary tumor and thyroid cancer which is somewhat suggestive of a sporadic disease. We, therefore, discussed and recommended the following at today's visit.   DISCUSSION: We reviewed that most thyroid cancers have a papillary pathology.  These are the most common form of thyroid cancer.  A more rare form of thyroid cancer is medullary thyroid cancer. When we see pituitary tumors and medullary thyroid cancer in one person it raises a suspicion of multiple endocrine neoplasia syndromes.  MEN1 most commonly is associated with pituitary tumors and  parathyroid tumors, while MEN2 is associated with medullary thyroid cancer, parathyroid tumors and pheochromocytomas.  There are other more rare forms of pituitary tumor syndromes where multiple family members will have these types of tumors.  We discussed with Cindy Ellis that the family history is not highly consistent with a familial hereditary cancer syndrome, and we feel she is at low risk to harbor a gene mutation associated with such a condition. Thus, we did not recommend any genetic testing, at this time, and recommended Cindy Ellis continue to follow the cancer screening guidelines given by her primary healthcare provider.  PLAN: We did not recommend any genetic testing for Cindy Ellis today.  We encouraged Cindy Ellis to remain in contact with cancer genetics annually so that we can continuously update the family history and inform her of any changes in cancer genetics and testing that may be of benefit for this family.   Ms.  Ellis's questions were answered  to her satisfaction today. Our contact information was provided should additional questions or concerns arise. Thank you for the referral and allowing [place] to share in the care of your patient.   Cindy Ellis, [place], [facility] Certified Genetic Counselor [email or web address] phone: [phone]  The patient was seen for a total of 60 minutes in face-to-face genetic counseling.  This patient was discussed with Drs. Magrinat, Lindi Adie and/or Burr Medico who agrees with the above.    _______________________________________________________________________ For Office Staff:  Number of people involved in session: 1 Was an Intern/ student involved with case: yes Julianne Streukens

## 2016-11-28 ENCOUNTER — Other Ambulatory Visit: Payer: Self-pay | Admitting: Radiation Therapy

## 2016-11-28 DIAGNOSIS — D352 Benign neoplasm of pituitary gland: Secondary | ICD-10-CM

## 2017-01-23 ENCOUNTER — Encounter: Payer: Self-pay | Admitting: Radiation Therapy

## 2017-01-24 ENCOUNTER — Ambulatory Visit
Admission: RE | Admit: 2017-01-24 | Discharge: 2017-01-24 | Disposition: A | Discharge status: Home / Self Care | Payer: BC Managed Care – PPO | Source: Ambulatory Visit | Attending: Radiation Oncology | Admitting: Radiation Oncology

## 2017-01-24 DIAGNOSIS — D352 Benign neoplasm of pituitary gland: Secondary | ICD-10-CM

## 2017-01-24 MED ORDER — GADOBENATE DIMEGLUMINE 529 MG/ML IV SOLN
10.0000 mL | Freq: Once | INTRAVENOUS | Status: AC | PRN
  Administered 2017-01-24: 10 mL via INTRAVENOUS

## 2017-07-21 ENCOUNTER — Other Ambulatory Visit: Payer: Self-pay | Admitting: Internal Medicine

## 2017-07-21 DIAGNOSIS — Z1239 Encounter for other screening for malignant neoplasm of breast: Secondary | ICD-10-CM

## 2017-07-30 ENCOUNTER — Ambulatory Visit
Admission: RE | Admit: 2017-07-30 | Discharge: 2017-07-30 | Disposition: A | Discharge status: Home / Self Care | Payer: BC Managed Care – PPO | Source: Ambulatory Visit | Attending: Internal Medicine | Admitting: Internal Medicine

## 2017-07-30 DIAGNOSIS — Z1239 Encounter for other screening for malignant neoplasm of breast: Secondary | ICD-10-CM

## 2017-07-31 ENCOUNTER — Other Ambulatory Visit: Payer: Self-pay | Admitting: Internal Medicine

## 2017-07-31 DIAGNOSIS — R928 Other abnormal and inconclusive findings on diagnostic imaging of breast: Secondary | ICD-10-CM

## 2017-08-07 ENCOUNTER — Ambulatory Visit: Payer: BC Managed Care – PPO

## 2017-08-07 ENCOUNTER — Ambulatory Visit
Admission: RE | Admit: 2017-08-07 | Discharge: 2017-08-07 | Disposition: A | Discharge status: Home / Self Care | Payer: BC Managed Care – PPO | Source: Ambulatory Visit | Attending: Internal Medicine | Admitting: Internal Medicine

## 2017-08-07 DIAGNOSIS — R928 Other abnormal and inconclusive findings on diagnostic imaging of breast: Secondary | ICD-10-CM

## 2017-11-20 ENCOUNTER — Other Ambulatory Visit: Payer: Self-pay | Admitting: Radiation Therapy

## 2017-11-20 DIAGNOSIS — D352 Benign neoplasm of pituitary gland: Secondary | ICD-10-CM

## 2017-12-26 ENCOUNTER — Other Ambulatory Visit: Payer: BC Managed Care – PPO

## 2018-01-19 ENCOUNTER — Other Ambulatory Visit: Payer: Self-pay

## 2018-01-19 ENCOUNTER — Emergency Department (HOSPITAL_COMMUNITY)
Admission: EM | Admit: 2018-01-19 | Discharge: 2018-01-19 | Discharge status: Against Medical Advice | Payer: BC Managed Care – PPO

## 2018-01-19 NOTE — ED Notes (Signed)
Pt. Left due to wait time.

## 2018-01-20 ENCOUNTER — Emergency Department (HOSPITAL_COMMUNITY)
Admission: EM | Admit: 2018-01-20 | Discharge: 2018-01-20 | Disposition: A | Discharge status: Home / Self Care | Payer: BC Managed Care – PPO | Attending: Emergency Medicine | Admitting: Emergency Medicine

## 2018-01-20 ENCOUNTER — Other Ambulatory Visit: Payer: Self-pay

## 2018-01-20 ENCOUNTER — Emergency Department (HOSPITAL_COMMUNITY): Payer: BC Managed Care – PPO

## 2018-01-20 ENCOUNTER — Encounter (HOSPITAL_COMMUNITY): Payer: Self-pay | Admitting: *Deleted

## 2018-01-20 DIAGNOSIS — M25511 Pain in right shoulder: Secondary | ICD-10-CM | POA: Diagnosis not present

## 2018-01-20 DIAGNOSIS — Y999 Unspecified external cause status: Secondary | ICD-10-CM | POA: Diagnosis not present

## 2018-01-20 DIAGNOSIS — Y92410 Unspecified street and highway as the place of occurrence of the external cause: Secondary | ICD-10-CM | POA: Insufficient documentation

## 2018-01-20 DIAGNOSIS — M545 Low back pain, unspecified: Secondary | ICD-10-CM

## 2018-01-20 DIAGNOSIS — I10 Essential (primary) hypertension: Secondary | ICD-10-CM | POA: Diagnosis not present

## 2018-01-20 DIAGNOSIS — Z85850 Personal history of malignant neoplasm of thyroid: Secondary | ICD-10-CM | POA: Diagnosis not present

## 2018-01-20 DIAGNOSIS — Z79899 Other long term (current) drug therapy: Secondary | ICD-10-CM | POA: Diagnosis not present

## 2018-01-20 DIAGNOSIS — Y9389 Activity, other specified: Secondary | ICD-10-CM | POA: Insufficient documentation

## 2018-01-20 DIAGNOSIS — R600 Localized edema: Secondary | ICD-10-CM | POA: Insufficient documentation

## 2018-01-20 DIAGNOSIS — M542 Cervicalgia: Secondary | ICD-10-CM | POA: Diagnosis not present

## 2018-01-20 MED ORDER — METHOCARBAMOL 500 MG PO TABS: 500.0000 mg | ORAL_TABLET | Freq: Two times a day (BID) | ORAL | 0 refills | Status: AC
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
## 2018-01-20 NOTE — ED Notes (Signed)
Pt is resting and appears comfortable.  Family at bedside.
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
## 2018-01-20 NOTE — Discharge Instructions (Signed)
It was a pleasure taking care of you today!  The pain your experiencing is likely due to muscle strain, your imaging looks good, you may take tylenol and Robaxin as needed for pain management. The muscle soreness should improve over the next week. Follow up with your family doctor in the next week for a recheck if you are still having symptoms. Return to ED if pain is worsening, you develop weakness or numbness of extremities, or new or concerning symptoms develop.

## 2018-01-23 ENCOUNTER — Ambulatory Visit
Admission: RE | Admit: 2018-01-23 | Discharge: 2018-01-23 | Disposition: A | Discharge status: Home / Self Care | Payer: BC Managed Care – PPO | Source: Ambulatory Visit | Attending: Radiation Oncology | Admitting: Radiation Oncology

## 2018-01-23 DIAGNOSIS — D352 Benign neoplasm of pituitary gland: Secondary | ICD-10-CM

## 2018-01-23 MED ORDER — GADOBENATE DIMEGLUMINE 529 MG/ML IV SOLN
9.0000 mL | Freq: Once | INTRAVENOUS | Status: AC | PRN
  Administered 2018-01-23: 9 mL via INTRAVENOUS

## 2018-02-09 ENCOUNTER — Ambulatory Visit
Admission: RE | Admit: 2018-02-09 | Discharge: 2018-02-09 | Disposition: A | Discharge status: Home / Self Care | Payer: BC Managed Care – PPO | Source: Ambulatory Visit | Attending: Internal Medicine | Admitting: Internal Medicine

## 2018-02-09 ENCOUNTER — Other Ambulatory Visit: Payer: Self-pay | Admitting: Internal Medicine

## 2018-02-09 DIAGNOSIS — M25572 Pain in left ankle and joints of left foot: Secondary | ICD-10-CM

## 2018-07-31 ENCOUNTER — Other Ambulatory Visit: Payer: Self-pay | Admitting: Internal Medicine

## 2018-07-31 DIAGNOSIS — Z1231 Encounter for screening mammogram for malignant neoplasm of breast: Secondary | ICD-10-CM

## 2018-09-03 ENCOUNTER — Ambulatory Visit
Admission: RE | Admit: 2018-09-03 | Discharge: 2018-09-03 | Disposition: A | Discharge status: Home / Self Care | Payer: BC Managed Care – PPO | Source: Ambulatory Visit | Attending: Internal Medicine | Admitting: Internal Medicine

## 2018-09-03 DIAGNOSIS — Z1231 Encounter for screening mammogram for malignant neoplasm of breast: Secondary | ICD-10-CM

## 2019-03-18 ENCOUNTER — Other Ambulatory Visit: Payer: Self-pay | Admitting: Radiation Therapy

## 2019-03-18 DIAGNOSIS — D352 Benign neoplasm of pituitary gland: Secondary | ICD-10-CM

## 2019-03-23 ENCOUNTER — Telehealth: Payer: Self-pay | Admitting: Radiation Therapy

## 2019-03-23 NOTE — Telephone Encounter (Signed)
Spoke with pt about her upcoming MRI in September. She will follow-up with Dr. Sherwood Gambler, his secretary Junie Panning, will call [facility] with that appointment since it is a little too far out for them to make right now. Cindy Ellis was very happy for the call, has the scan information written down and will let me know if she has any other questions or concerns.    Mont Dutton R.T.(R)(T) Special Procedures Navigator

## 2019-06-29 ENCOUNTER — Other Ambulatory Visit: Payer: Self-pay | Admitting: Radiation Therapy

## 2019-07-01 ENCOUNTER — Other Ambulatory Visit: Payer: Self-pay

## 2019-07-01 ENCOUNTER — Ambulatory Visit
Admission: RE | Admit: 2019-07-01 | Discharge: 2019-07-01 | Disposition: A | Discharge status: Home / Self Care | Payer: BC Managed Care – PPO | Source: Ambulatory Visit | Attending: Radiation Oncology | Admitting: Radiation Oncology

## 2019-07-01 DIAGNOSIS — D352 Benign neoplasm of pituitary gland: Secondary | ICD-10-CM

## 2019-07-01 MED ORDER — GADOBENATE DIMEGLUMINE 529 MG/ML IV SOLN
10.0000 mL | Freq: Once | INTRAVENOUS | Status: AC | PRN
  Administered 2019-07-01: 10 mL via INTRAVENOUS

## 2019-07-05 ENCOUNTER — Inpatient Hospital Stay: Payer: BC Managed Care – PPO | Attending: Internal Medicine

## 2019-07-07 DIAGNOSIS — G4733 Obstructive sleep apnea (adult) (pediatric): Secondary | ICD-10-CM | POA: Diagnosis not present

## 2019-08-02 ENCOUNTER — Other Ambulatory Visit: Payer: BC Managed Care – PPO

## 2019-08-04 ENCOUNTER — Ambulatory Visit: Payer: Self-pay | Admitting: Urology

## 2019-10-11 ENCOUNTER — Other Ambulatory Visit: Payer: Self-pay | Admitting: Internal Medicine

## 2019-10-11 DIAGNOSIS — Z1231 Encounter for screening mammogram for malignant neoplasm of breast: Secondary | ICD-10-CM

## 2019-11-22 DIAGNOSIS — Z0000 Encounter for general adult medical examination without abnormal findings: Secondary | ICD-10-CM | POA: Diagnosis not present

## 2019-11-22 DIAGNOSIS — G4730 Sleep apnea, unspecified: Secondary | ICD-10-CM | POA: Diagnosis not present

## 2019-11-22 DIAGNOSIS — E7800 Pure hypercholesterolemia, unspecified: Secondary | ICD-10-CM | POA: Diagnosis not present

## 2019-11-25 DIAGNOSIS — D352 Benign neoplasm of pituitary gland: Secondary | ICD-10-CM | POA: Diagnosis not present

## 2019-11-25 DIAGNOSIS — Z85850 Personal history of malignant neoplasm of thyroid: Secondary | ICD-10-CM | POA: Diagnosis not present

## 2019-11-25 DIAGNOSIS — E039 Hypothyroidism, unspecified: Secondary | ICD-10-CM | POA: Diagnosis not present

## 2019-12-01 ENCOUNTER — Ambulatory Visit
Admission: RE | Admit: 2019-12-01 | Discharge: 2019-12-01 | Disposition: A | Discharge status: Home / Self Care | Payer: BC Managed Care – PPO | Source: Ambulatory Visit | Attending: Internal Medicine | Admitting: Internal Medicine

## 2019-12-01 ENCOUNTER — Ambulatory Visit: Payer: BC Managed Care – PPO

## 2019-12-01 ENCOUNTER — Other Ambulatory Visit: Payer: Self-pay

## 2019-12-01 DIAGNOSIS — E669 Obesity, unspecified: Secondary | ICD-10-CM | POA: Diagnosis not present

## 2019-12-01 DIAGNOSIS — Z1231 Encounter for screening mammogram for malignant neoplasm of breast: Secondary | ICD-10-CM | POA: Diagnosis not present

## 2019-12-01 DIAGNOSIS — D352 Benign neoplasm of pituitary gland: Secondary | ICD-10-CM | POA: Diagnosis not present

## 2019-12-01 DIAGNOSIS — Z85850 Personal history of malignant neoplasm of thyroid: Secondary | ICD-10-CM | POA: Diagnosis not present

## 2019-12-01 DIAGNOSIS — E039 Hypothyroidism, unspecified: Secondary | ICD-10-CM | POA: Diagnosis not present

## 2019-12-10 DIAGNOSIS — G4733 Obstructive sleep apnea (adult) (pediatric): Secondary | ICD-10-CM | POA: Diagnosis not present

## 2019-12-30 DIAGNOSIS — Z1211 Encounter for screening for malignant neoplasm of colon: Secondary | ICD-10-CM | POA: Diagnosis not present

## 2020-01-25 DIAGNOSIS — Z01419 Encounter for gynecological examination (general) (routine) without abnormal findings: Secondary | ICD-10-CM | POA: Diagnosis not present

## 2020-02-03 DIAGNOSIS — E039 Hypothyroidism, unspecified: Secondary | ICD-10-CM | POA: Diagnosis not present

## 2020-02-03 DIAGNOSIS — D352 Benign neoplasm of pituitary gland: Secondary | ICD-10-CM | POA: Diagnosis not present

## 2020-02-03 DIAGNOSIS — Z85850 Personal history of malignant neoplasm of thyroid: Secondary | ICD-10-CM | POA: Diagnosis not present

## 2020-04-27 DIAGNOSIS — Z03818 Encounter for observation for suspected exposure to other biological agents ruled out: Secondary | ICD-10-CM | POA: Diagnosis not present

## 2020-05-02 DIAGNOSIS — Z1211 Encounter for screening for malignant neoplasm of colon: Secondary | ICD-10-CM | POA: Diagnosis not present

## 2020-05-02 DIAGNOSIS — K640 First degree hemorrhoids: Secondary | ICD-10-CM | POA: Diagnosis not present

## 2020-08-23 DIAGNOSIS — G4733 Obstructive sleep apnea (adult) (pediatric): Secondary | ICD-10-CM | POA: Diagnosis not present

## 2020-09-02 DIAGNOSIS — Z20822 Contact with and (suspected) exposure to covid-19: Secondary | ICD-10-CM | POA: Diagnosis not present

## 2020-11-08 ENCOUNTER — Other Ambulatory Visit: Payer: Self-pay | Admitting: Internal Medicine

## 2020-11-08 DIAGNOSIS — Z1231 Encounter for screening mammogram for malignant neoplasm of breast: Secondary | ICD-10-CM

## 2020-11-13 DIAGNOSIS — K1321 Leukoplakia of oral mucosa, including tongue: Secondary | ICD-10-CM | POA: Diagnosis not present

## 2020-11-23 DIAGNOSIS — E039 Hypothyroidism, unspecified: Secondary | ICD-10-CM | POA: Diagnosis not present

## 2020-12-01 DIAGNOSIS — D352 Benign neoplasm of pituitary gland: Secondary | ICD-10-CM | POA: Diagnosis not present

## 2020-12-01 DIAGNOSIS — Z85850 Personal history of malignant neoplasm of thyroid: Secondary | ICD-10-CM | POA: Diagnosis not present

## 2020-12-01 DIAGNOSIS — E039 Hypothyroidism, unspecified: Secondary | ICD-10-CM | POA: Diagnosis not present

## 2020-12-13 DIAGNOSIS — E039 Hypothyroidism, unspecified: Secondary | ICD-10-CM | POA: Diagnosis not present

## 2020-12-13 DIAGNOSIS — E2839 Other primary ovarian failure: Secondary | ICD-10-CM | POA: Diagnosis not present

## 2020-12-13 DIAGNOSIS — G4730 Sleep apnea, unspecified: Secondary | ICD-10-CM | POA: Diagnosis not present

## 2020-12-13 DIAGNOSIS — Z0000 Encounter for general adult medical examination without abnormal findings: Secondary | ICD-10-CM | POA: Diagnosis not present

## 2020-12-13 DIAGNOSIS — D352 Benign neoplasm of pituitary gland: Secondary | ICD-10-CM | POA: Diagnosis not present

## 2020-12-13 DIAGNOSIS — Z85850 Personal history of malignant neoplasm of thyroid: Secondary | ICD-10-CM | POA: Diagnosis not present

## 2020-12-14 ENCOUNTER — Other Ambulatory Visit: Payer: Self-pay | Admitting: Family Medicine

## 2020-12-14 ENCOUNTER — Other Ambulatory Visit: Payer: Self-pay | Admitting: Internal Medicine

## 2020-12-14 DIAGNOSIS — E2839 Other primary ovarian failure: Secondary | ICD-10-CM

## 2020-12-18 ENCOUNTER — Other Ambulatory Visit: Payer: Self-pay

## 2020-12-18 ENCOUNTER — Ambulatory Visit
Admission: RE | Admit: 2020-12-18 | Discharge: 2020-12-18 | Disposition: A | Discharge status: Home / Self Care | Payer: Medicare Other | Source: Ambulatory Visit | Attending: Internal Medicine | Admitting: Internal Medicine

## 2020-12-18 DIAGNOSIS — Z1231 Encounter for screening mammogram for malignant neoplasm of breast: Secondary | ICD-10-CM

## 2020-12-23 IMAGING — MG DIGITAL SCREENING BILAT W/ CAD
4 series · 4 of 4 positions shown · non-contrast
Comparison: Previous exam(s).

CLINICAL DATA: Screening.

EXAM:
DIGITAL SCREENING BILATERAL MAMMOGRAM WITH CAD

[R MLO]
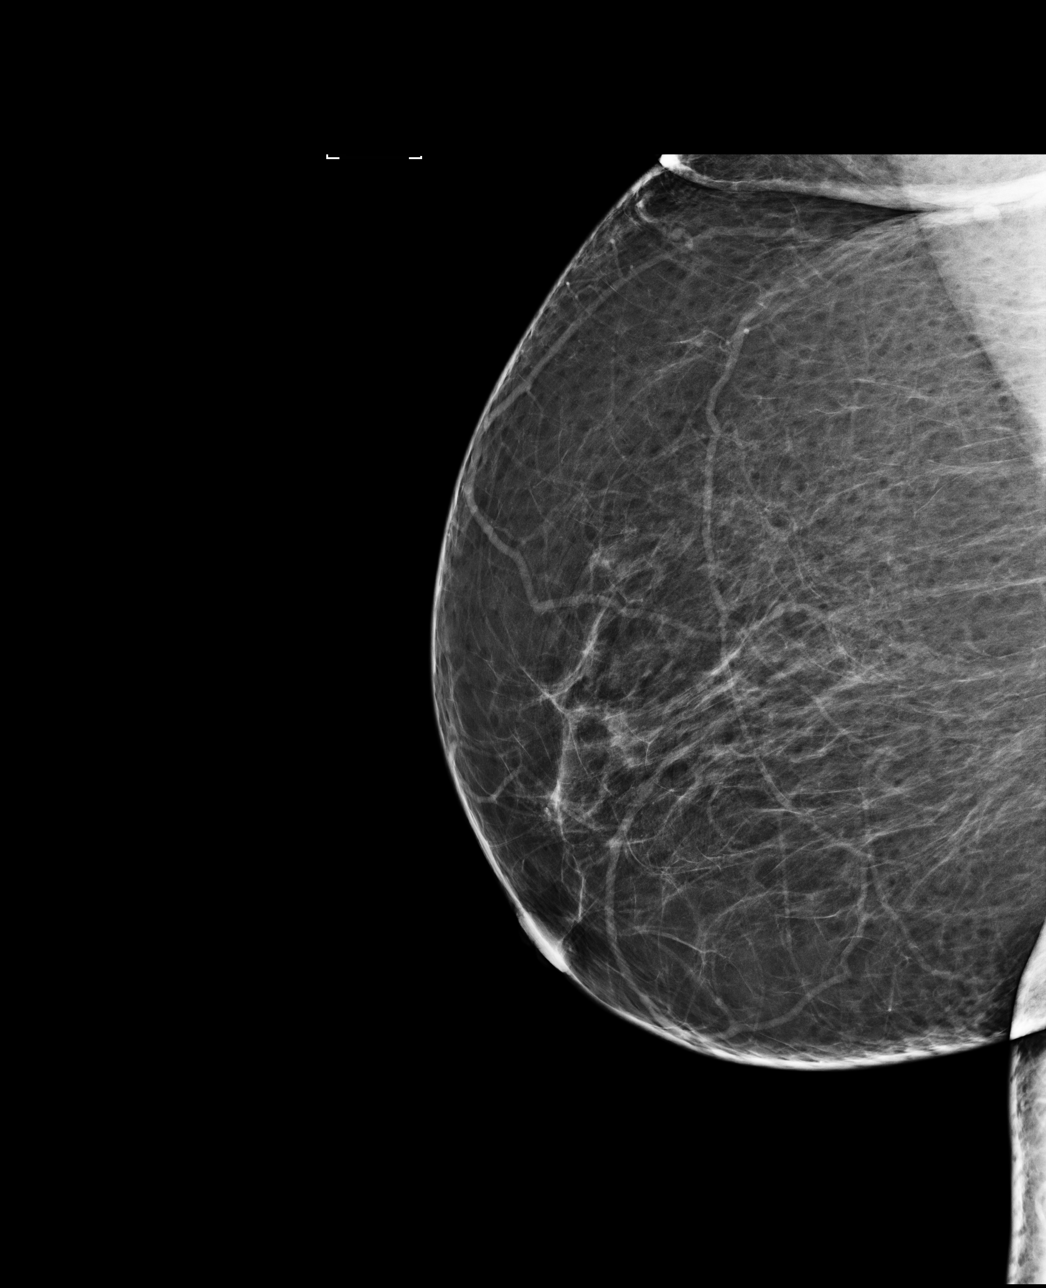

[R CC]
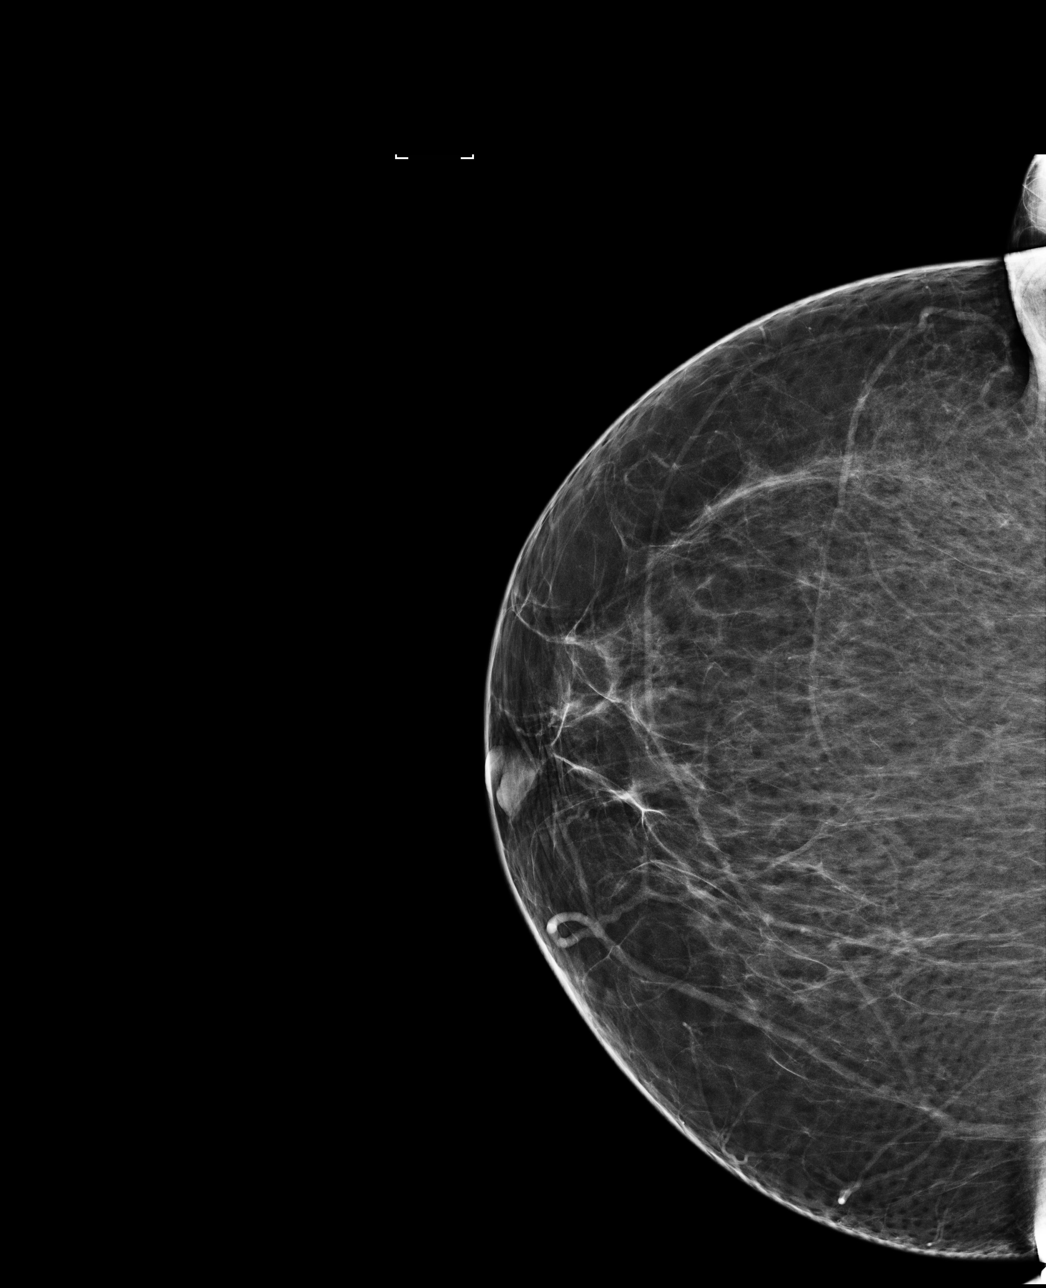

[L MLO]
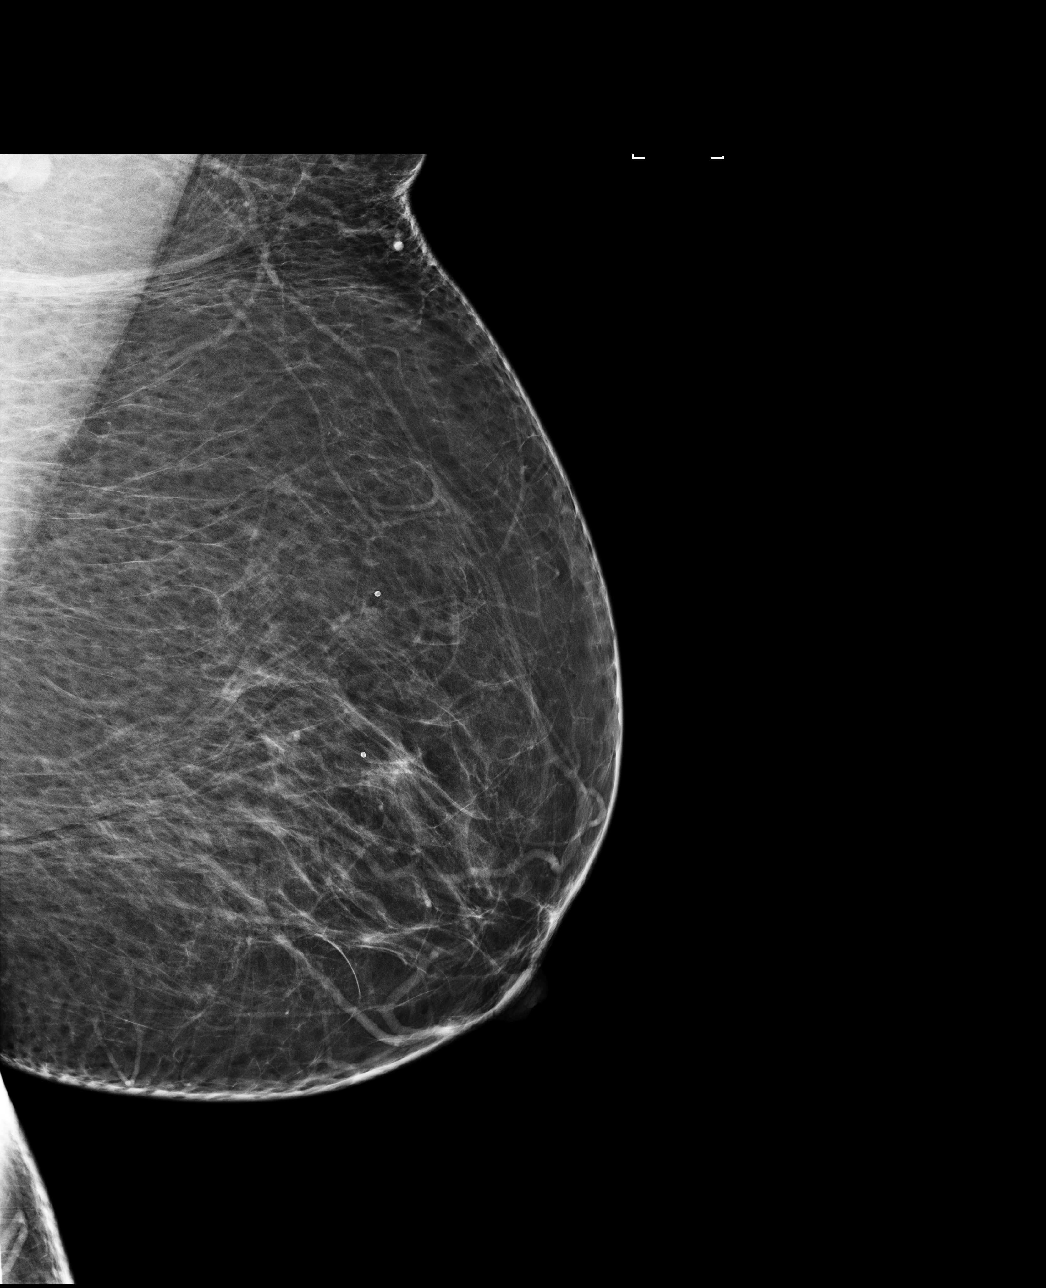

[L CC]
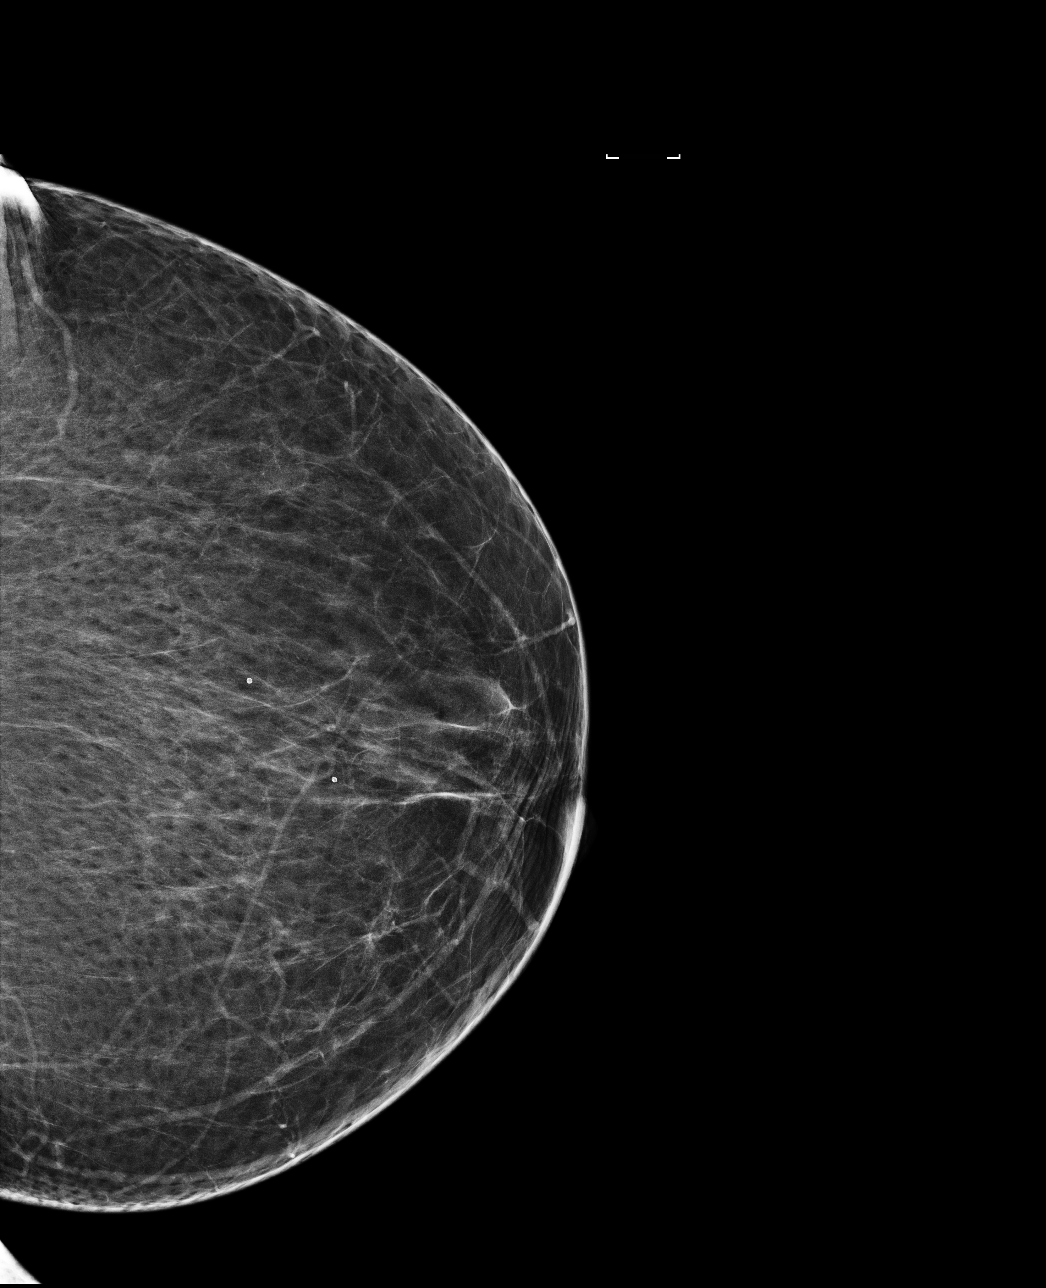

[4 of 4 positions shown; findings below may reference images not displayed]

ACR Breast Density Category b: There are scattered areas of
fibroglandular density.
FINDINGS: There are no findings suspicious for malignancy. Images were
processed with CAD.
IMPRESSION: No mammographic evidence of malignancy. A result letter of this
screening mammogram will be mailed directly to the patient.

RECOMMENDATION:
Screening mammogram in one year. (Code:AS-G-LCT)

BI-RADS CATEGORY  1: Negative.

## 2021-02-23 DIAGNOSIS — G4733 Obstructive sleep apnea (adult) (pediatric): Secondary | ICD-10-CM | POA: Diagnosis not present

## 2021-02-27 DIAGNOSIS — Z8619 Personal history of other infectious and parasitic diseases: Secondary | ICD-10-CM | POA: Diagnosis not present

## 2021-02-27 DIAGNOSIS — Z9189 Other specified personal risk factors, not elsewhere classified: Secondary | ICD-10-CM | POA: Diagnosis not present

## 2021-02-27 DIAGNOSIS — Z7251 High risk heterosexual behavior: Secondary | ICD-10-CM | POA: Diagnosis not present

## 2021-04-04 ENCOUNTER — Other Ambulatory Visit: Payer: Self-pay

## 2021-04-04 ENCOUNTER — Ambulatory Visit
Admission: RE | Admit: 2021-04-04 | Discharge: 2021-04-04 | Disposition: A | Discharge status: Home / Self Care | Payer: Medicare Other | Source: Ambulatory Visit | Attending: Internal Medicine | Admitting: Internal Medicine

## 2021-04-04 DIAGNOSIS — E2839 Other primary ovarian failure: Secondary | ICD-10-CM

## 2021-04-04 DIAGNOSIS — Z780 Asymptomatic menopausal state: Secondary | ICD-10-CM | POA: Diagnosis not present

## 2021-07-05 ENCOUNTER — Other Ambulatory Visit: Payer: Self-pay | Admitting: Neurosurgery

## 2021-07-05 DIAGNOSIS — D497 Neoplasm of unspecified behavior of endocrine glands and other parts of nervous system: Secondary | ICD-10-CM

## 2021-07-23 ENCOUNTER — Ambulatory Visit
Admission: RE | Admit: 2021-07-23 | Discharge: 2021-07-23 | Disposition: A | Discharge status: Home / Self Care | Payer: Medicare Other | Source: Ambulatory Visit | Attending: Neurosurgery | Admitting: Neurosurgery

## 2021-07-23 DIAGNOSIS — D352 Benign neoplasm of pituitary gland: Secondary | ICD-10-CM | POA: Diagnosis not present

## 2021-07-23 DIAGNOSIS — I6381 Other cerebral infarction due to occlusion or stenosis of small artery: Secondary | ICD-10-CM | POA: Diagnosis not present

## 2021-07-23 DIAGNOSIS — D497 Neoplasm of unspecified behavior of endocrine glands and other parts of nervous system: Secondary | ICD-10-CM

## 2021-07-23 DIAGNOSIS — G939 Disorder of brain, unspecified: Secondary | ICD-10-CM | POA: Diagnosis not present

## 2021-07-23 MED ORDER — GADOBENATE DIMEGLUMINE 529 MG/ML IV SOLN
10.0000 mL | Freq: Once | INTRAVENOUS | Status: AC | PRN
  Administered 2021-07-23: 10 mL via INTRAVENOUS

## 2021-07-25 DIAGNOSIS — D497 Neoplasm of unspecified behavior of endocrine glands and other parts of nervous system: Secondary | ICD-10-CM | POA: Diagnosis not present

## 2021-10-19 DIAGNOSIS — G4733 Obstructive sleep apnea (adult) (pediatric): Secondary | ICD-10-CM | POA: Diagnosis not present

## 2021-10-31 DIAGNOSIS — I10 Essential (primary) hypertension: Secondary | ICD-10-CM | POA: Diagnosis not present

## 2021-10-31 DIAGNOSIS — G4730 Sleep apnea, unspecified: Secondary | ICD-10-CM | POA: Diagnosis not present

## 2021-11-16 ENCOUNTER — Other Ambulatory Visit: Payer: Self-pay | Admitting: Internal Medicine

## 2021-11-16 DIAGNOSIS — Z1231 Encounter for screening mammogram for malignant neoplasm of breast: Secondary | ICD-10-CM

## 2021-11-19 DIAGNOSIS — G4733 Obstructive sleep apnea (adult) (pediatric): Secondary | ICD-10-CM | POA: Diagnosis not present

## 2021-11-20 ENCOUNTER — Other Ambulatory Visit: Payer: Self-pay

## 2021-11-20 ENCOUNTER — Ambulatory Visit: Payer: Medicare Other | Admitting: Podiatry

## 2021-11-20 DIAGNOSIS — B353 Tinea pedis: Secondary | ICD-10-CM | POA: Diagnosis not present

## 2021-11-20 MED ORDER — CLOTRIMAZOLE-BETAMETHASONE 1-0.05 % EX CREA: 1.0000 "application " | TOPICAL_CREAM | Freq: Two times a day (BID) | CUTANEOUS | 0 refills | Status: AC

## 2021-11-20 NOTE — Progress Notes (Signed)
Subjective:  Patient ID: Cindy Ellis, female    DOB: 1956/08/06,  MRN: [id number]  Chief Complaint  Patient presents with   Toe Pain    Bilateral toe itching wants ankle examined     66 y.o. female presents with the above complaint.  Patient presents with bilateral hallux athlete's foot concern on the plantar aspect.  Patient states there is a lot of itching associated with it.  She wanted get it evaluated.  She has not had this before.  She is not a diabetic.  She denies any other acute complaints.  She has not seen anyone else prior to seeing me for this.  She is tried some over-the-counter stuff none of which has helped.   Review of Systems: Negative except as noted in the HPI. Denies N/V/F/Ch.  Past Medical History:  Diagnosis Date   Anemia    hx of;when she was younger   Cancer [facility])    thyroid cancer   Family history of pancreatic cancer    History of blood transfusion    "related to hysterectomy/fibroids"   History of colon polyps    Hyperlipidemia    takes Atorvastatin daily   Hypertension    takes Lisinopril,Atenolol,Amlodipine,and Triam-HCTZ daily   Iron deficiency anemia    Neoplasm of thyroid    Pituitary tumor    Wears glasses     Current Outpatient Medications:    amLODipine (NORVASC) 5 MG tablet, amlodipine 5 mg tablet, Disp: , Rfl:    clotrimazole-betamethasone (LOTRISONE) cream, Apply 1 application topically 2 (two) times daily., Disp: 30 g, Rfl: 0   levothyroxine (EUTHYROX) 150 MCG tablet, 1 tablet in the morning on an empty stomach, Disp: , Rfl:    levothyroxine (SYNTHROID) 50 MCG tablet, Take by mouth., Disp: , Rfl:    albuterol (PROAIR HFA) 108 (90 Base) MCG/ACT inhaler, ProAir HFA 90 mcg/actuation aerosol inhaler, Disp: , Rfl:    Aloe-Sodium Chloride (AYR SALINE NASAL GEL) SWAB, , Disp: , Rfl:    amLODipine (NORVASC) 5 MG tablet, Take 5 mg by mouth., Disp: , Rfl:    atenolol (TENORMIN) 25 MG tablet, Take 25 mg by mouth., Disp: , Rfl:     Calcium Carb-Cholecalciferol (CALCIUM 600/VITAMIN D3) 600-800 MG-UNIT TABS, Take 2 capsules by mouth daily., Disp: , Rfl:    Cholecalciferol (VITAMIN D3) 2000 UNITS capsule, Take 2,000 Units by mouth daily., Disp: , Rfl:    EUTHYROX 150 MCG tablet, Take 150 mcg by mouth every morning., Disp: , Rfl:    ferrous sulfate 325 (65 FE) MG tablet, Take 325 mg by mouth daily with breakfast., Disp: , Rfl:    Garlic 7371 MG CAPS, Take 1,000 mg by mouth daily., Disp: , Rfl:    Garlic 0626 MG CAPS, 1 capsule, Disp: , Rfl:    ibuprofen (ADVIL) 400 MG tablet, ibuprofen 400 mg tablet, Disp: , Rfl:    levothyroxine (SYNTHROID, LEVOTHROID) 175 MCG tablet, Take 150 mcg by mouth daily before breakfast. , Disp: , Rfl:    lisinopril (PRINIVIL,ZESTRIL) 40 MG tablet, Take 40 mg by mouth daily. , Disp: , Rfl:    lisinopril (ZESTRIL) 40 MG tablet, lisinopril 40 mg tablet, Disp: , Rfl:    methocarbamol (ROBAXIN) 500 MG tablet, Take 1 tablet (500 mg total) by mouth 2 (two) times daily., Disp: 20 tablet, Rfl: 0   Misc Natural Products (TURMERIC CURCUMIN) CAPS, 1 tab 900mg , Disp: , Rfl:    Multiple Vitamins-Minerals (MULTIVITAMIN ADULTS 50+) TABS, 1 tablet, Disp: , Rfl:  Omega 3 1000 MG CAPS, Take 1,000 mg by mouth daily., Disp: , Rfl:    predniSONE (DELTASONE) 5 MG tablet, prednisone 5 mg tablets in a dose pack, Disp: , Rfl:    rosuvastatin (CRESTOR) 20 MG tablet, Take 20 mg by mouth daily., Disp: , Rfl:    SM MULTIPLE VITAMINS/IRON TABS, Take 1 tablet by mouth daily. , Disp: , Rfl:    sodium chloride (OCEAN) 0.65 % nasal spray, , Disp: , Rfl:    sodium chloride (OCEAN) 0.65 % nasal spray, Place into the nose., Disp: , Rfl:    triamterene-hydrochlorothiazide (MAXZIDE-25) 37.5-25 MG tablet, Take by mouth., Disp: , Rfl:    vitamin B-12 (CYANOCOBALAMIN) 1000 MCG tablet, Take 1,000 mcg by mouth daily., Disp: , Rfl:    vitamin B-12 (CYANOCOBALAMIN) 500 MCG tablet, Take by mouth., Disp: , Rfl:    Zinc 50 MG TABS, 1 tablet,  Disp: , Rfl:    zinc gluconate 50 MG tablet, Take by mouth., Disp: , Rfl:   Social History   Tobacco Use  Smoking Status Never  Smokeless Tobacco Never    Allergies  Allergen Reactions   Lisinopril     Other reaction(s): fatigue   Objective:  There were no vitals filed for this visit. There is no height or weight on file to calculate BMI. Constitutional Well developed. Well nourished.  Vascular Dorsalis pedis pulses palpable bilaterally. Posterior tibial pulses palpable bilaterally. Capillary refill normal to all digits.  No cyanosis or clubbing noted. Pedal hair growth normal.  Neurologic Normal speech. Oriented to person, place, and time. Epicritic sensation to light touch grossly present bilaterally.  Dermatologic Bilateral plantar hallux athlete's foot.  Subjective component of itching noted.  Mild epidermal lysis noted.  Orthopedic: Normal joint ROM without pain or crepitus bilaterally. No visible deformities. No bony tenderness.   Radiographs: None Assessment:   1. Tinea pedis of both feet    Plan:  Patient was evaluated and treated and all questions answered.  Bilateral plantar hallux athlete's foot -I explained the patient etiology of athlete's foot emergency room and options were extensively discussed.  Given that she is tried some over-the-counter medication none of which has helped I believe she will benefit from Lotrisone cream.  I have asked her to apply twice a day.  Lotrisone cream was sent to the pharmacy.  No follow-ups on file.

## 2021-11-30 DIAGNOSIS — Z85850 Personal history of malignant neoplasm of thyroid: Secondary | ICD-10-CM | POA: Diagnosis not present

## 2021-11-30 DIAGNOSIS — D352 Benign neoplasm of pituitary gland: Secondary | ICD-10-CM | POA: Diagnosis not present

## 2021-11-30 DIAGNOSIS — E039 Hypothyroidism, unspecified: Secondary | ICD-10-CM | POA: Diagnosis not present

## 2021-12-19 ENCOUNTER — Ambulatory Visit
Admission: RE | Admit: 2021-12-19 | Discharge: 2021-12-19 | Disposition: A | Discharge status: Home / Self Care | Payer: Medicare Other | Source: Ambulatory Visit | Attending: Internal Medicine | Admitting: Internal Medicine

## 2021-12-19 ENCOUNTER — Ambulatory Visit: Payer: Medicare Other | Admitting: Podiatry

## 2021-12-19 DIAGNOSIS — Z1231 Encounter for screening mammogram for malignant neoplasm of breast: Secondary | ICD-10-CM | POA: Diagnosis not present

## 2021-12-20 DIAGNOSIS — G4733 Obstructive sleep apnea (adult) (pediatric): Secondary | ICD-10-CM | POA: Diagnosis not present

## 2021-12-25 DIAGNOSIS — Z5181 Encounter for therapeutic drug level monitoring: Secondary | ICD-10-CM | POA: Diagnosis not present

## 2021-12-25 DIAGNOSIS — E7800 Pure hypercholesterolemia, unspecified: Secondary | ICD-10-CM | POA: Diagnosis not present

## 2021-12-25 DIAGNOSIS — Z8639 Personal history of other endocrine, nutritional and metabolic disease: Secondary | ICD-10-CM | POA: Diagnosis not present

## 2021-12-25 DIAGNOSIS — I10 Essential (primary) hypertension: Secondary | ICD-10-CM | POA: Diagnosis not present

## 2021-12-25 DIAGNOSIS — Z0000 Encounter for general adult medical examination without abnormal findings: Secondary | ICD-10-CM | POA: Diagnosis not present

## 2022-03-05 DIAGNOSIS — Z9189 Other specified personal risk factors, not elsewhere classified: Secondary | ICD-10-CM | POA: Diagnosis not present

## 2022-03-05 DIAGNOSIS — Z7251 High risk heterosexual behavior: Secondary | ICD-10-CM | POA: Diagnosis not present

## 2022-04-24 DIAGNOSIS — G4733 Obstructive sleep apnea (adult) (pediatric): Secondary | ICD-10-CM | POA: Diagnosis not present

## 2022-05-24 DIAGNOSIS — G4733 Obstructive sleep apnea (adult) (pediatric): Secondary | ICD-10-CM | POA: Diagnosis not present

## 2022-06-24 DIAGNOSIS — G4733 Obstructive sleep apnea (adult) (pediatric): Secondary | ICD-10-CM | POA: Diagnosis not present

## 2022-10-18 DIAGNOSIS — R059 Cough, unspecified: Secondary | ICD-10-CM | POA: Diagnosis not present

## 2022-10-18 DIAGNOSIS — J019 Acute sinusitis, unspecified: Secondary | ICD-10-CM | POA: Diagnosis not present

## 2022-10-18 DIAGNOSIS — J209 Acute bronchitis, unspecified: Secondary | ICD-10-CM | POA: Diagnosis not present

## 2022-10-18 DIAGNOSIS — R0981 Nasal congestion: Secondary | ICD-10-CM | POA: Diagnosis not present

## 2022-11-19 ENCOUNTER — Other Ambulatory Visit: Payer: Self-pay | Admitting: Internal Medicine

## 2022-11-19 DIAGNOSIS — Z1231 Encounter for screening mammogram for malignant neoplasm of breast: Secondary | ICD-10-CM

## 2022-12-02 DIAGNOSIS — E039 Hypothyroidism, unspecified: Secondary | ICD-10-CM | POA: Diagnosis not present

## 2022-12-02 DIAGNOSIS — D352 Benign neoplasm of pituitary gland: Secondary | ICD-10-CM | POA: Diagnosis not present

## 2022-12-02 DIAGNOSIS — Z85850 Personal history of malignant neoplasm of thyroid: Secondary | ICD-10-CM | POA: Diagnosis not present

## 2023-01-08 ENCOUNTER — Ambulatory Visit
Admission: RE | Admit: 2023-01-08 | Discharge: 2023-01-08 | Disposition: A | Discharge status: Home / Self Care | Payer: Medicare Other | Source: Ambulatory Visit | Attending: Internal Medicine | Admitting: Internal Medicine

## 2023-01-08 DIAGNOSIS — Z1231 Encounter for screening mammogram for malignant neoplasm of breast: Secondary | ICD-10-CM

## 2023-01-22 DIAGNOSIS — Z6841 Body Mass Index (BMI) 40.0 and over, adult: Secondary | ICD-10-CM | POA: Diagnosis not present

## 2023-01-22 DIAGNOSIS — E7800 Pure hypercholesterolemia, unspecified: Secondary | ICD-10-CM | POA: Diagnosis not present

## 2023-01-22 DIAGNOSIS — Z0000 Encounter for general adult medical examination without abnormal findings: Secondary | ICD-10-CM | POA: Diagnosis not present

## 2023-01-22 DIAGNOSIS — I10 Essential (primary) hypertension: Secondary | ICD-10-CM | POA: Diagnosis not present

## 2023-01-22 DIAGNOSIS — Z5181 Encounter for therapeutic drug level monitoring: Secondary | ICD-10-CM | POA: Diagnosis not present

## 2023-04-14 ENCOUNTER — Other Ambulatory Visit: Payer: Self-pay | Admitting: Obstetrics and Gynecology

## 2023-04-14 DIAGNOSIS — Z9189 Other specified personal risk factors, not elsewhere classified: Secondary | ICD-10-CM | POA: Diagnosis not present

## 2023-04-14 DIAGNOSIS — Z7251 High risk heterosexual behavior: Secondary | ICD-10-CM | POA: Diagnosis not present

## 2023-04-14 DIAGNOSIS — N904 Leukoplakia of vulva: Secondary | ICD-10-CM | POA: Diagnosis not present

## 2023-05-05 DIAGNOSIS — L900 Lichen sclerosus et atrophicus: Secondary | ICD-10-CM | POA: Diagnosis not present

## 2023-05-05 DIAGNOSIS — N9089 Other specified noninflammatory disorders of vulva and perineum: Secondary | ICD-10-CM | POA: Diagnosis not present

## 2023-07-23 DIAGNOSIS — G620 Drug-induced polyneuropathy: Secondary | ICD-10-CM | POA: Diagnosis not present

## 2023-11-17 ENCOUNTER — Other Ambulatory Visit: Payer: Self-pay | Admitting: Internal Medicine

## 2023-11-17 DIAGNOSIS — Z1231 Encounter for screening mammogram for malignant neoplasm of breast: Secondary | ICD-10-CM

## 2024-01-09 ENCOUNTER — Ambulatory Visit
Admission: RE | Admit: 2024-01-09 | Discharge: 2024-01-09 | Disposition: A | Discharge status: Home / Self Care | Payer: Medicare PPO | Source: Ambulatory Visit | Attending: Internal Medicine | Admitting: Internal Medicine

## 2024-01-09 DIAGNOSIS — Z1231 Encounter for screening mammogram for malignant neoplasm of breast: Secondary | ICD-10-CM

## 2024-05-19 DIAGNOSIS — M545 Low back pain, unspecified: Secondary | ICD-10-CM | POA: Diagnosis not present

## 2024-05-19 DIAGNOSIS — M25519 Pain in unspecified shoulder: Secondary | ICD-10-CM | POA: Diagnosis not present

## 2024-05-21 DIAGNOSIS — M7542 Impingement syndrome of left shoulder: Secondary | ICD-10-CM | POA: Diagnosis not present

## 2024-05-21 DIAGNOSIS — M545 Low back pain, unspecified: Secondary | ICD-10-CM | POA: Diagnosis not present

## 2024-06-03 DIAGNOSIS — M5412 Radiculopathy, cervical region: Secondary | ICD-10-CM | POA: Diagnosis not present

## 2024-06-03 DIAGNOSIS — M545 Low back pain, unspecified: Secondary | ICD-10-CM | POA: Diagnosis not present

## 2024-06-04 DIAGNOSIS — Z9189 Other specified personal risk factors, not elsewhere classified: Secondary | ICD-10-CM | POA: Diagnosis not present

## 2024-06-04 DIAGNOSIS — Z7251 High risk heterosexual behavior: Secondary | ICD-10-CM | POA: Diagnosis not present

## 2024-06-17 DIAGNOSIS — M545 Low back pain, unspecified: Secondary | ICD-10-CM | POA: Diagnosis not present

## 2024-06-17 DIAGNOSIS — M5412 Radiculopathy, cervical region: Secondary | ICD-10-CM | POA: Diagnosis not present

## 2024-06-25 DIAGNOSIS — M545 Low back pain, unspecified: Secondary | ICD-10-CM | POA: Diagnosis not present

## 2024-07-07 DIAGNOSIS — M5412 Radiculopathy, cervical region: Secondary | ICD-10-CM | POA: Diagnosis not present

## 2024-07-07 DIAGNOSIS — M545 Low back pain, unspecified: Secondary | ICD-10-CM | POA: Diagnosis not present

## 2024-07-21 DIAGNOSIS — M545 Low back pain, unspecified: Secondary | ICD-10-CM | POA: Diagnosis not present

## 2024-07-21 DIAGNOSIS — M5412 Radiculopathy, cervical region: Secondary | ICD-10-CM | POA: Diagnosis not present

## 2024-08-04 DIAGNOSIS — M545 Low back pain, unspecified: Secondary | ICD-10-CM | POA: Diagnosis not present

## 2024-08-04 DIAGNOSIS — M5412 Radiculopathy, cervical region: Secondary | ICD-10-CM | POA: Diagnosis not present

## 2024-11-01 ENCOUNTER — Other Ambulatory Visit: Payer: Self-pay | Admitting: Internal Medicine

## 2024-11-01 DIAGNOSIS — Z1231 Encounter for screening mammogram for malignant neoplasm of breast: Secondary | ICD-10-CM

## 2025-01-10 ENCOUNTER — Ambulatory Visit

## 2025-01-11 ENCOUNTER — Ambulatory Visit
